# Patient Record
Sex: Male | Born: 1950
Health system: Southern US, Community
[De-identification: ages and names within clinical notes are randomized; demographics above are authoritative.]

## PROBLEM LIST (undated history)

## (undated) DIAGNOSIS — E119 Type 2 diabetes mellitus without complications: Secondary | ICD-10-CM

## (undated) DIAGNOSIS — I10 Essential (primary) hypertension: Secondary | ICD-10-CM

## (undated) DIAGNOSIS — E785 Hyperlipidemia, unspecified: Secondary | ICD-10-CM

## (undated) DIAGNOSIS — K859 Acute pancreatitis without necrosis or infection, unspecified: Secondary | ICD-10-CM

## (undated) DIAGNOSIS — K219 Gastro-esophageal reflux disease without esophagitis: Secondary | ICD-10-CM

## (undated) HISTORY — PX: UPPER GASTROINTESTINAL ENDOSCOPY: SHX188

## (undated) HISTORY — PX: COLONOSCOPY: SHX174

## (undated) HISTORY — PX: CHOLECYSTECTOMY: SHX55

## (undated) HISTORY — DX: Hyperlipidemia, unspecified: E78.5

## (undated) HISTORY — DX: Acute pancreatitis without necrosis or infection, unspecified: K85.90

## (undated) HISTORY — DX: Type 2 diabetes mellitus without complications: E11.9

## (undated) HISTORY — DX: Essential (primary) hypertension: I10

## (undated) HISTORY — DX: Gastro-esophageal reflux disease without esophagitis: K21.9

---

## 2018-12-05 ENCOUNTER — Telehealth: Payer: Self-pay

## 2018-12-05 NOTE — Telephone Encounter (Signed)

## 2018-12-06 ENCOUNTER — Encounter: Payer: Self-pay | Admitting: Family Medicine

## 2018-12-06 ENCOUNTER — Other Ambulatory Visit: Payer: Self-pay

## 2018-12-06 ENCOUNTER — Ambulatory Visit (INDEPENDENT_AMBULATORY_CARE_PROVIDER_SITE_OTHER): Payer: Medicare Other | Admitting: Family Medicine

## 2018-12-06 VITALS — BP 110/70 | HR 86 | Ht 68.0 in | Wt 211.0 lb

## 2018-12-06 DIAGNOSIS — E7800 Pure hypercholesterolemia, unspecified: Secondary | ICD-10-CM

## 2018-12-06 DIAGNOSIS — K219 Gastro-esophageal reflux disease without esophagitis: Secondary | ICD-10-CM | POA: Diagnosis not present

## 2018-12-06 DIAGNOSIS — E1040 Type 1 diabetes mellitus with diabetic neuropathy, unspecified: Secondary | ICD-10-CM

## 2018-12-06 DIAGNOSIS — I10 Essential (primary) hypertension: Secondary | ICD-10-CM | POA: Diagnosis not present

## 2018-12-06 DIAGNOSIS — H9110 Presbycusis, unspecified ear: Secondary | ICD-10-CM | POA: Diagnosis not present

## 2018-12-06 DIAGNOSIS — Z0001 Encounter for general adult medical examination with abnormal findings: Secondary | ICD-10-CM

## 2018-12-06 LAB — URINALYSIS, ROUTINE W REFLEX MICROSCOPIC
Bilirubin Urine: NEGATIVE
Hgb urine dipstick: NEGATIVE
Ketones, ur: NEGATIVE
Leukocytes,Ua: NEGATIVE
Nitrite: NEGATIVE
RBC / HPF: NONE SEEN (ref 0–?)
Specific Gravity, Urine: 1.02 (ref 1.000–1.030)
Urine Glucose: NEGATIVE
Urobilinogen, UA: 0.2 (ref 0.0–1.0)
pH: 5.5 (ref 5.0–8.0)

## 2018-12-06 LAB — CBC
HCT: 45.3 % (ref 39.0–52.0)
Hemoglobin: 15 g/dL (ref 13.0–17.0)
MCHC: 33.1 g/dL (ref 30.0–36.0)
MCV: 85.1 fl (ref 78.0–100.0)
Platelets: 194 10*3/uL (ref 150.0–400.0)
RBC: 5.32 Mil/uL (ref 4.22–5.81)
RDW: 14.1 % (ref 11.5–15.5)
WBC: 7.8 10*3/uL (ref 4.0–10.5)

## 2018-12-06 LAB — COMPREHENSIVE METABOLIC PANEL
ALT: 21 U/L (ref 0–53)
AST: 24 U/L (ref 0–37)
Albumin: 4.2 g/dL (ref 3.5–5.2)
Alkaline Phosphatase: 76 U/L (ref 39–117)
BUN: 31 mg/dL — ABNORMAL HIGH (ref 6–23)
CO2: 27 mEq/L (ref 19–32)
Calcium: 9.4 mg/dL (ref 8.4–10.5)
Chloride: 104 mEq/L (ref 96–112)
Creatinine, Ser: 1.4 mg/dL (ref 0.40–1.50)
GFR: 50.66 mL/min — ABNORMAL LOW (ref >60.00)
Glucose, Bld: 141 mg/dL — ABNORMAL HIGH (ref 70–99)
Potassium: 4.1 mEq/L (ref 3.5–5.1)
Sodium: 140 mEq/L (ref 135–145)
Total Bilirubin: 0.8 mg/dL (ref 0.2–1.2)
Total Protein: 7.2 g/dL (ref 6.0–8.3)

## 2018-12-06 LAB — LIPID PANEL
Cholesterol: 213 mg/dL — ABNORMAL HIGH (ref 0–200)
HDL: 34.1 mg/dL — ABNORMAL LOW (ref >39.00)
NonHDL: 179.2
Total CHOL/HDL Ratio: 6
Triglycerides: 249 mg/dL — ABNORMAL HIGH (ref 0.0–149.0)
VLDL: 49.8 mg/dL — ABNORMAL HIGH (ref 0.0–40.0)

## 2018-12-06 LAB — MICROALBUMIN / CREATININE URINE RATIO
Creatinine,U: 115.8 mg/dL
Microalb Creat Ratio: 14 mg/g (ref 0.0–30.0)
Microalb, Ur: 16.2 mg/dL — ABNORMAL HIGH (ref 0.0–1.9)

## 2018-12-06 LAB — LDL CHOLESTEROL, DIRECT: Direct LDL: 129 mg/dL

## 2018-12-06 LAB — HEMOGLOBIN A1C: Hgb A1c MFr Bld: 7.3 % — ABNORMAL HIGH (ref 4.6–6.5)

## 2018-12-06 MED ORDER — GABAPENTIN 300 MG PO CAPS: 300.0000 mg | ORAL_CAPSULE | Freq: Every day | ORAL | 3 refills | Status: DC

## 2018-12-06 NOTE — Progress Notes (Addendum)
Established Patient Office Visit  Subjective:  Patient ID: Marco Payne, male    DOB: July 13, 1950  Age: 68 y.o. MRN: [id number]  CC:  Chief Complaint  Patient presents with  . Establish Care    HPI [name] presents for establishment of care he recently moved into the area.  Tells that he has been an insulin-dependent diabetic for 35 years.  Diabetes is been under so-so control.  Fasting sugar this morning was 135.  Status post eye check 4 months ago and was told that his eyes were okay.  He does experience some burning and heaviness in his feet.  No recent dental care but has partial bridges.  He has not had a colonoscopy recently.  He is retired from work.  He does not do regular exercise.  Blood pressure is controlled with amlodipine and Zestoretic.  Currently not taking anything for his cholesterol.  History of GERD that is been controlled with pantoprazole.  History reviewed. No pertinent past medical history.  History reviewed. No pertinent surgical history.  History reviewed. No pertinent family history.  Social History   Socioeconomic History  . Marital status: Divorced    Spouse name: Not on file  . Number of children: Not on file  . Years of education: Not on file  . Highest education level: Not on file  Occupational History  . Not on file  Social Needs  . Financial resource strain: Not on file  . Food insecurity    Worry: Not on file    Inability: Not on file  . Transportation needs    Medical: Not on file    Non-medical: Not on file  Tobacco Use  . Smoking status: Never Smoker  . Smokeless tobacco: Never Used  Substance and Sexual Activity  . Alcohol use: Not on file    Comment: rarely  . Drug use: Never  . Sexual activity: Not on file  Lifestyle  . Physical activity    Days per week: Not on file    Minutes per session: Not on file  . Stress: Not on file  Relationships  . Social Herbalist on phone: Not on file    Gets together: Not  on file    Attends religious service: Not on file    Active member of club or organization: Not on file    Attends meetings of clubs or organizations: Not on file    Relationship status: Not on file  . Intimate partner violence    Fear of current or ex partner: Not on file    Emotionally abused: Not on file    Physically abused: Not on file    Forced sexual activity: Not on file  Other Topics Concern  . Not on file  Social History Narrative  . Not on file    Outpatient Medications Prior to Visit  Medication Sig Dispense Refill  . amLODipine (NORVASC) 10 MG tablet Take 10 mg by mouth daily.    . insulin aspart (NOVOLOG FLEXPEN) 100 UNIT/ML FlexPen Inject 20 Units into the skin 3 (three) times daily with meals.    . Insulin Glargine (BASAGLAR KWIKPEN) 100 UNIT/ML SOPN Inject 30 Units into the skin at bedtime.    Marland Kitchen lisinopril-hydrochlorothiazide (ZESTORETIC) 20-12.5 MG tablet Take 1 tablet by mouth daily.    . pantoprazole (PROTONIX) 40 MG tablet Take 40 mg by mouth daily.     No facility-administered medications prior to visit.     No Known Allergies  ROS Review of Systems  Constitutional: Negative.   HENT: Negative.   Eyes: Negative for photophobia and visual disturbance.  Respiratory: Negative.   Cardiovascular: Negative.   Gastrointestinal: Negative.   Endocrine: Negative for polyphagia and polyuria.  Genitourinary: Positive for difficulty urinating, frequency and urgency.  Musculoskeletal: Negative for gait problem and joint swelling.  Skin: Negative for pallor and rash.  Allergic/Immunologic: Negative for immunocompromised state.  Neurological: Negative for seizures and speech difficulty.  Hematological: Does not bruise/bleed easily.  Psychiatric/Behavioral: Negative.       Objective:    Physical Exam  Constitutional: He is oriented to person, place, and time. He appears well-developed and well-nourished. No distress.  HENT:  Head: Normocephalic and atraumatic.   Right Ear: External ear normal.  Left Ear: External ear normal.  Eyes: Pupils are equal, round, and reactive to light. Conjunctivae are normal. Right eye exhibits no discharge. Left eye exhibits no discharge. No scleral icterus.  Neck: Neck supple. No JVD present. No tracheal deviation present. No thyromegaly present.  Cardiovascular: Normal rate, regular rhythm and normal heart sounds.  Pulses:      Dorsalis pedis pulses are 1+ on the right side and 1+ on the left side.       Posterior tibial pulses are 1+ on the right side and 1+ on the left side.  Pulmonary/Chest: Effort normal and breath sounds normal. No stridor.  Abdominal: Soft. Bowel sounds are normal. He exhibits no distension. There is no abdominal tenderness. There is no rebound.  Musculoskeletal:        General: No edema.  Lymphadenopathy:    He has no cervical adenopathy.  Neurological: He is alert and oriented to person, place, and time.  Skin: Skin is warm and dry. He is not diaphoretic.  Psychiatric: He has a normal mood and affect. His behavior is normal.   Diabetic Foot Exam - Simple   Simple Foot Form Diabetic Foot exam was performed with the following findings: Yes 12/06/2018 10:11 AM  Visual Inspection No deformities, no ulcerations, no other skin breakdown bilaterally: Yes See comments: Yes Sensation Testing Intact to touch and monofilament testing bilaterally: Yes Pulse Check See comments: Yes Comments Feet are cavus.  Capillary refill brisk.      BP 110/70   Pulse 86   Ht 5\' 8"  (1.727 m)   Wt 211 lb (95.7 kg)   SpO2 96%   BMI 32.08 kg/m  Wt Readings from Last 3 Encounters:  12/06/18 211 lb (95.7 kg)   BP Readings from Last 3 Encounters:  12/06/18 110/70   Guideline developer:  UpToDate (see UpToDate for funding source) Date Released: June 2014  Health Maintenance Due  Topic Date Due  . Hepatitis C Screening  Aug 03, 1950  . OPHTHALMOLOGY EXAM  08/31/1962  . TETANUS/TDAP  08/31/1971  .  COLONOSCOPY  08/31/2002  . PNA vac Low Risk Adult (1 of 2 - PCV13) 08/30/2017  . INFLUENZA VACCINE  10/19/2018    There are no preventive care reminders to display for this patient.  No results found for: TSH Lab Results  Component Value Date   WBC 7.8 12/06/2018   HGB 15.0 12/06/2018   HCT 45.3 12/06/2018   MCV 85.1 12/06/2018   PLT 194.0 12/06/2018   Lab Results  Component Value Date   NA 140 12/06/2018   K 4.1 12/06/2018   CO2 27 12/06/2018   GLUCOSE 141 (H) 12/06/2018   BUN 31 (H) 12/06/2018   CREATININE 1.40 12/06/2018   BILITOT 0.8  12/06/2018   ALKPHOS 76 12/06/2018   AST 24 12/06/2018   ALT 21 12/06/2018   PROT 7.2 12/06/2018   ALBUMIN 4.2 12/06/2018   CALCIUM 9.4 12/06/2018   GFR 50.66 (L) 12/06/2018   Lab Results  Component Value Date   CHOL 213 (H) 12/06/2018   Lab Results  Component Value Date   HDL 34.10 (L) 12/06/2018   [facility] results found for: [facility] Results  Component Value Date   TRIG 249.0 (H) 12/06/2018   Lab Results  Component Value Date   CHOLHDL 6 12/06/2018   Lab Results  Component Value Date   HGBA1C 7.3 (H) 12/06/2018      Assessment & Plan:   Problem List Items Addressed This Visit      Cardiovascular and Mediastinum   Essential hypertension - Primary   Relevant Medications   lisinopril-hydrochlorothiazide (ZESTORETIC) 20-12.5 MG tablet   amLODipine (NORVASC) 10 MG tablet   atorvastatin (LIPITOR) 20 MG tablet   Other Relevant Orders   CBC (Completed)   Comprehensive metabolic panel (Completed)   Urinalysis, Routine w reflex microscopic (Completed)   Microalbumin / creatinine urine ratio (Completed)     Digestive   Gastroesophageal reflux disease   Relevant Medications   pantoprazole (PROTONIX) 40 MG tablet   Other Relevant Orders   Ambulatory referral to Gastroenterology     Endocrine   Type 1 diabetes mellitus with diabetic neuropathy, unspecified (HCC)   Relevant Medications   lisinopril-hydrochlorothiazide  (ZESTORETIC) 20-12.5 MG tablet   Insulin Glargine (BASAGLAR KWIKPEN) 100 UNIT/ML SOPN   insulin aspart (NOVOLOG FLEXPEN) 100 UNIT/ML FlexPen   gabapentin (NEURONTIN) 300 MG capsule   atorvastatin (LIPITOR) 20 MG tablet   Other Relevant Orders   CBC (Completed)   Comprehensive metabolic panel (Completed)   LDL cholesterol, direct (Completed)   Hemoglobin A1c (Completed)   Lipid panel (Completed)   Urinalysis, Routine w reflex microscopic (Completed)   Microalbumin / creatinine urine ratio (Completed)   Ambulatory referral to Endocrinology     Nervous and Auditory   Presbycusis   Relevant Orders   Ambulatory referral to Audiology     Other   Encounter for health maintenance examination with abnormal findings   Elevated LDL cholesterol level   Relevant Medications   atorvastatin (LIPITOR) 20 MG tablet      Meds ordered this encounter  Medications  . gabapentin (NEURONTIN) 300 MG capsule    Sig: Take 1 capsule (300 mg total) by mouth at bedtime.    Dispense:  90 capsule    Refill:  3  . atorvastatin (LIPITOR) 20 MG tablet    Sig: Take 1 tablet (20 mg total) by mouth daily.    Dispense:  90 tablet    Refill:  3    Follow-up: Return in about 3 months (around 03/07/2019).   Patient was given information on diabetes, colorectal cancer screening, preventing high cholesterol and neuropathic pain all in Spanish.

## 2018-12-06 NOTE — Patient Instructions (Signed)
Dolor neuroptico Neuropathic Pain El dolor neuroptico se produce cuando hay dao en los nervios responsables de ciertas sensaciones del cuerpo (nervios sensitivos). Las causas del dolor pueden ser las siguientes:  Dao a los nervios sensitivos que envan seales a la mdula espinal y el cerebro (sistema nervioso perifrico).  Dao a los nervios sensitivos del cerebro o la mdula espinal (sistema nervioso central). El dolor neuroptico puede volverlo ms sensible al [employer]. An una sensacin leve puede sentirse Writer. Por lo general, es una enfermedad a largo plazo que puede ser difcil de tratar. El tipo de dolor difiere de Ardelia Mems persona a Theatre manager. Puede:  Comenzar en forma repentina (agudo) o puede desarrollarse lentamente y durar Management consultant (crnico).  Aparecer y Armed forces operational officer a medida que los nervios daados se curan, o puede permanecer estable durante aos.  Provoca [place], prdida de sueo y [place] calidad de vida deficiente. Cules son las causas? La causa ms frecuente de esta afeccin es la diabetes. El dolor neuroptico tambin puede producirse por muchas otras enfermedades y afecciones. Las causas del dolor neuroptico pueden clasificarse como:  Txicas. Esto es causado por medicamentos y sustancias qumicas. La causa ms comn del dolor neuroptico por exposicin a sustancias txicas es el dao que producen los tratamientos para Science writer (quimioterapia).  Metablicas. Esto puede ser consecuencia de: ? Diabetes. Esta es la enfermedad ms comn que provoca daos en los nervios. ? Falta de vitamina B debido al consumo de alcohol prolongado.  Traumticas. Las lesiones que cortan, presionan o estiran un nervio pueden producir dao y Social research officer, government. Un ejemplo comn es sentir dolor despus de perder un brazo o una pierna (dolor de miembro fantasma).  Causas relacionadas con la compresin. Si un nervio sensitivo queda atrapado o comprimido por [employer], el suministro de sangre al  nervio puede interrumpirse.  Vascular. Muchas enfermedades de los vasos sanguneos pueden producir dolor neuroptico al reducir el suministro de [place] y el oxgeno que Lucianne Lei a los nervios.  Autoinmune. Este tipo de Social research officer, government se produce por las [employer] el sistema de defensa del cuerpo (sistema inmunitario) ataca por error los nervios sensitivos. Entre los ejemplos de enfermedades autoinmunes que pueden causar dolor neuroptico se incluyen el lupus y Curator.  Infecciosa. Muchos tipos de infecciones virales pueden daar los nervios sensitivos y Engineer, drilling. La infeccin por culebrilla (virus del herpes zster) es una causa comn de este tipo de Social research officer, government.  Heredados. El dolor neuroptico puede ser un sntoma de muchas enfermedades que se transmiten entre los miembros de las familias (genticas). Qu incrementa el riesgo? Es ms probable que usted sufra esta afeccin si:  Tiene diabetes.  Fuma.  Bebe alcohol en exceso.  Toma determinados medicamentos, incluidos medicamentos que destruyen las clulas cancerosas (quimioterapia) o que se utilizan para tratar trastornos del sistema inmunitario. Cules son los signos o los sntomas? El sntoma principal es Conservation officer, historic buildings. El dolor neuroptico a menudo se describe como:  Insurance claims handler.  Similar a un choque.  Escozor.  Fro o calor.  Picazn. Cmo se diagnostica? No hay un estudio que pueda diagnosticar el dolor neuroptico. Se diagnostica en funcin de lo siguiente:  Un examen fsico y sus sntomas. El mdico le har preguntas acerca de su dolor. Pueden solicitarle que use una escala de dolor para describir la intensidad del dolor.  Estudios. Estos pueden realizarse para corroborar su sensibilidad al dolor y para ayudar a Animator causa y la ubicacin de los daos en los nervios sensitivos. Estos  incluyen los siguientes: ? Estudios de conduccin nerviosa para evaluar si las seales nerviosas pasan por los nervios  sensitivos en forma correcta o no (estudios electrodiagnsticos). ? Estimulacin de los nervios sensitivos a travs de electrodos que se colocan en la piel, y la medicin de la respuesta en la mdula espinal y el cerebro (potencial evocado somatosensorial).  Estudios de diagnstico por imgenes, por ejemplo: ? Radiografas. ? Exploracin por tomografa computarizada (TC). ? Resonancia magntica (RM). Cmo se trata? El tratamiento para el dolor neuroptico puede cambiar con el [place]. Es posible que necesite probar distintas opciones de tratamientos o una combinacin de tratamientos. Entre las opciones se incluyen las siguientes:  Tratamiento de la causa preexistente de la neuropata, como la diabetes, una enfermedad renal o la deficiencia de vitaminas.  Interrupcin de los [employer] pueden causar la neuropata, como la quimioterapia.  Medicamentos para Engineer, materials. Entre los medicamentos se [employer] siguientes: ? Analgsicos de venta libre o recetados. ? Anticonvulsivos. ? Antidepresivos. ? Parches analgsicos que se colocan en las zonas de la piel que le duelen. ? Un medicamento para adormecer el rea (anestesia local), que puede inyectarse como un bloqueo nervioso.  Neuroestimulacin transcutnea. En este tratamiento se utilizan corrientes elctricas para bloquear las seales nerviosas dolorosas. El tratamiento es indoloro.  Tratamientos alternativos, como: ? Acupuntura. ? Meditacin. ? Masajes. ? Fisioterapia. ? Programas para el control del dolor. ? Psicoterapia. Siga estas indicaciones en su casa: Medicamentos   [employer] International de venta libre y los recetados solamente como se lo haya indicado el mdico.  No conduzca ni use maquinaria pesada mientras toma analgsicos recetados.  Si toma analgsicos recetados, tome medidas para prevenir o tratar el estreimiento. El mdico puede recomendarle que: ? Beba suficiente lquido como para mantener la orina de  color amarillo plido. ? Consuma alimentos ricos en fibra, como frutas y verduras frescas, cereales integrales y frijoles. ? Limite el consumo de alimentos ricos en grasa y azcares procesados, como los alimentos fritos o dulces. ? Tome un medicamento recetado o de venta libre para el estreimiento. Estilo de vida   Tenga un buen sistema de Immunologist.  Considere la opcin de unirse a un grupo de apoyo para Chief Technology Officer crnico.  No consuma ningn producto que contenga nicotina o tabaco, como cigarrillos y Administrator, Civil Service. Si necesita ayuda para dejar de fumar, consulte al mdico.  No beba alcohol. Instrucciones generales  Infrmese todo lo que pueda sobre su enfermedad.  Trabaje en estrecha colaboracin con todos sus mdicos para hallar el tratamiento ms adecuado para usted.  Pregntele al mdico qu actividades son seguras para usted.  Concurra a todas las visitas de control como se lo haya indicado el mdico. Esto es importante. Comunquese con un mdico si:  Sus tratamientos para el dolor no funcionan.  Los [employer] causan [employer] Corporation.  Est lidiando con sntomas de cansancio (fatiga), cambios en el estado de nimo, depresin o ansiedad. Resumen  El dolor neuroptico se produce cuando hay dao en los nervios responsables de ciertas sensaciones del cuerpo (nervios sensitivos).  El dolor neuroptico puede aparecer y Geneticist, molecular a medida que los nervios daados se curan, o puede permanecer estable durante aos.  Por lo general, el dolor neuroptico es una enfermedad a largo plazo que puede ser difcil de Warehouse manager. Considere la opcin de unirse a un grupo de apoyo para Chief Technology Officer crnico. Esta informacin no tiene Theme park manager el consejo del mdico. Asegrese de hacerle al mdico  cualquier pregunta que tenga. Document Released: 06/13/2007 Document Revised: 05/16/2017 Document Reviewed: 05/16/2017 Elsevier Patient Education  2020 [employer].   Prevencin de las complicaciones de la diabetes mellitus Preventing Diabetes Mellitus Complications Usted puede actuar para prevenir o disminuir los problemas causados por la diabetes (diabetes mellitus). Seguir un plan para la diabetes y cuidarse usted mismo puede reducir el riesgo de complicaciones graves o potencialmente mortales. Qu puedo hacer para prevenir las complicaciones de la diabetes? Controle su diabetes   Siga las indicaciones del mdico acerca de cmo tratar la diabetes. Su diabetes puede ser tratada por un equipo de profesionales de la salud que le pueden ensear a cuidarse y pueden responder las preguntas que tenga.  Instryase sobre su afeccin para tomar decisiones saludables en relacin a los alimentos y la actividad fsica.  Controle su nivel de azcar en la sangre (glucosa) con la frecuencia que le hayan indicado. El mdico lo ayudar a decidir con qu frecuencia debe revisar su nivel de glucemia, en funcin de los objetivos de su tratamiento y el xito en cumplirlos.  Consltele a su mdico si debe tomar aspirina en dosis bajas a diario y cul es la dosis recomendada para usted. Tomar aspirina en dosis bajas a diario se recomienda para ayudar a prevenir la enfermedad cardiovascular. No consuma nicotina ni tabaco No consuma ningn producto que contenga nicotina o tabaco, como cigarrillos y cigarrillos electrnicos. Si necesita ayuda para dejar de fumar, consulte al mdico. La nicotina aumenta el riesgo de problemas con la diabetes. Si deja de consumir nicotina:  Se reduce el riesgo de infarto de miocardio, accidente cerebrovascular, enfermedades del sistema nervioso y enfermedad renal.  Pueden mejorar el colesterol y sus niveles de presin arterial.  La circulacin de la sangre mejorar. Mantenga su presin arterial bajo control Su objetivo de presin arterial personal se determina sobre la base de:  Su edad.  Los medicamentos que toma.  El tiempo transcurrido  desde que tiene diabetes.  Otras afecciones mdicas que tenga. Para controlar su presin arterial:  Siga las indicaciones del mdico sobre la planificacin de las comidas, el ejercicio y los medicamentos.  Asegrese de que el [employer] mida la presin arterial en cada visita mdica.  Contrlese la presin arterial en su casa segn las indicaciones del mdico.  Mantenga los niveles de colesterol bajo control Para controlar su colesterol:  Siga las indicaciones del mdico sobre la planificacin de las comidas, el ejercicio y los medicamentos.  Controle su nivel de colesterol por lo menos una vez al ao.  Es posible que le receten medicamentos para bajar sus niveles de colesterol (estatinas). Si no est tomando estatinas, pregntele a su mdico si debera tomar una. Controlar su colesterol, puede:  Ayudarlo a prevenir enfermedades cardacas y un accidente cerebrovascular. Estos son los problemas de salud ms comunes para las personas con diabetes.  Mejorar el flujo de [place]. Planifique y cumpla con sus exmenes fsicos y oculares anuales Su mdico le informar con qu frecuencia deber asistir a visitas mdicas, dependiendo de su plan de control de la diabetes. Concurra a todas las visitas de seguimiento como se le haya indicado. Esto es importante para identificar rpidamente posibles problemas y poder evitar o tratar las complicaciones.  Cada visita a su mdico deber incluir la medicin de: ? Su peso. ? Presin arterial. ? Nivel de glucemia.  Su nivel de A1c (hemoglobina A1c) debe controlarse: ? Al menos 2 veces al ao, si cumple los objetivos del tratamiento. ? [employer] al  ao, si no cumple los objetivos del tratamiento o si sus objetivos [place]han cambiado.  Los lpidos de la sangre (perfil lipdico) deben controlarse anualmente. Tambin se debe controlar anualmente la presencia de protenas en la orina (microalbuminuria).  Si tiene diabetes tipo1, hgase un examen ocular en  el trmino de 3 a 5aos despus del diagnstico y, luego, Neomia Dearuna vez al ao despus del Risk managerprimer examen.  Si tiene diabetes tipo2, hgase un examen ocular tan pronto como le diagnostiquen la enfermedad y, Mercerluego, una vez por ao despus del Risk managerprimer examen. Mantngase al da con las vacunas Se recomienda que reciba:  [place] PrincipeVacuna antigripal (gripe) todos los aos.  Vacuna contra la neumona (vacuna antineumoccica) y contra la hepatitisB. Si es mayor de 65aos, puede recibir la vacuna antineumoccica como una serie de dos inyecciones West Parkdiferentes. Pregntele al mdico qu otras vacunas puede recomendar para usted. Cuide sus pies La diabetes puede hacer que la circulacin sangunea en las piernas y los pies sea deficiente. Por eso, el cuidado de los pies es muy importante. La diabetes puede provocar:  Que la piel de los pies se vuelva ms fina, se rompa ms fcilmente y cicatrice ms lentamente.  Dao nervioso en las piernas y los pies, lo que puede Forensic scientistresultar en una disminucin de la sensibilidad. Es posible que no advierta las heridas ms pequeas que pueden conducir a Systems developerproblemas graves. Para evitar problemas en los pies:  Examine a diario su piel y pies en busca de cortes, moretones, enrojecimiento, ampollas o llagas.  Programe una cita para que el Office Depotmdico le controle los pies una vez por ao. Este examen incluye: ? Inspeccionar la estructura y la piel de sus pies. ? Revisar los pulsos y sensaciones de sus pies.  Asegrese de que su mdico realice un examen visual de los pies en cada visita mdica.  Cuide sus Ameren Corporationdientes Las personas con diabetes mal controlada son ms propensas a Immunologisttener enfermedades en las encas (periodontales). La diabetes puede hacer que las enfermedades periodontales sean ms difciles de Chief Operating Officercontrolar. Las enfermedades periodontales, si no se tratan, pueden conducir a la prdida de dientes. Para prevenirlas:  Cepllese los Jacobs Engineeringdientes dos veces al da.  Use hilo dental al menos una vez al  da.  Visite al Group 1 Automotivedentista dos veces por ao. Beba de manera responsable Limite el consumo de alcohol a no ms de 1medida por da si es mujer y no est Dodsonembarazada, y a 2medidas por da si es hombre. Una medida equivale a 12oz (355ml) de cerveza, 5oz (148ml) de vino o 1oz (44ml) de bebidas alcohlicas de alta graduacin.  Es importante que consuma alimentos cuando bebe alcohol para evitar la glucemia baja (hipoglucemia). Evite consumir alcohol si:  Tiene antecedentes de consumo excesivo o dependencia de alcohol.  Est embarazada.  Tiene enfermedad heptica, pancreatitis, neuropata avanzada, o hipertrigliceridemia grave. Disminuya el nivel de estrs Vivir con diabetes puede ser estresante. Cuando usted est bajo estrs, la glucemia puede verse afectada de dos maneras:  Las hormonas del estrs pueden hacer que el nivel de glucemia suba.  Probablemente deje de cuidarse como debera. Est al tanto del nivel de estrs y haga los cambios que sean necesarios para ayudar a Company secretarymanejar las situaciones difciles. Para disminuir sus niveles de estrs:  Considere la posibilidad de Advertising account plannerparticipar en un grupo de apoyo.  Realice relajacin o meditacin planificada.  Adopte un pasatiempo que disfrute.  Mantenga relaciones saludables.  Hacer ejercicio regularmente.  Trabaje con su mdico o un profesional de la salud mental. Resumen  Usted  puede actuar para prevenir o [employer] problemas causados por la diabetes (diabetes mellitus). Seguir un plan para la diabetes y cuidarse usted mismo puede reducir el riesgo de complicaciones graves o potencialmente mortales.  Siga las indicaciones del mdico acerca de cmo tratar la diabetes. Su diabetes puede ser tratada por un equipo de profesionales de la salud que le pueden ensear a cuidarse y pueden responder las preguntas que tenga.  Su mdico le informar con qu frecuencia deber asistir a visitas mdicas, dependiendo de su plan de control de la  diabetes. Concurra a todas las visitas de seguimiento como se le haya indicado. Esto es importante para identificar rpidamente posibles problemas y poder evitar o tratar las complicaciones. Esta informacin no tiene Marine scientist el consejo del mdico. Asegrese de hacerle al mdico cualquier pregunta que tenga. Document Released: 02/23/2011 Document Revised: 01/04/2017 Document Reviewed: 04/30/2013 Elsevier Patient Education  [place] deteccin de Surveyor, minerals Colorectal Cancer Screening  Las pruebas de deteccin de cncer colorrectal se usan para confirmar o descartar este tipo de cncer antes de que se desarrollen los sntomas. Colorrectal se refiere al colon y al recto. El colon y el recto se encuentran al final del tubo digestivo y all se eliminan las deposiciones hacia fuera del cuerpo. Quin debe realizarse la prueba de deteccin? [employer] adultos a Proofreader de los 50 aos [employer] 75 aos deben hacerse pruebas de Programme researcher, broadcasting/film/video. El mdico puede recomendarle las pruebas de deteccin a partir de los 40 aos. Le realizarn pruebas cada 1 a 10 aos, segn los [place] y el tipo de prueba de Programme researcher, broadcasting/film/video. Puede realizarse pruebas de deteccin a partir de una edad ms temprana o con ms frecuencia que [employer], si usted tiene alguno de los siguientes factores de riesgo:  Antecedentes personales o familiares de cncer colorrectal o bultos anormales (plipos).  Una enfermedad inflamatoria del intestino, como colitis ulcerosa o enfermedad de Crohn.  Antecedentes de radioterapia en el abdomen o rea plvica para tratamiento de cncer.  Sntomas de Surveyor, minerals, como cambios en los hbitos intestinales o sangre en las heces.  Un tipo de sndrome de cncer de colon que se transmite de padres a hijos (hereditario), como: ? Sndrome de Field seismologist. ? Poliposis adenomatosa familiar. ? Sndrome de [place]. ? Sndrome de Peutz-Jeghers. Las recomendaciones de  pruebas de Programme researcher, broadcasting/film/video para los adultos que tienen entre 92 y 65 aos de edad Carnuel segn la salud. Cmo se realiza este estudio? Hay diversos tipos de pruebas de Manufacturing systems engineer. Pueden hacerle una o ms de las siguientes:  Prueba de sangre oculta en la materia fecal con guayacol. Para realizar esta prueba, se examina una muestra de heces (materia fecal) a fin de Consulting civil engineer oculta (escondida), lo que podra ser un signo de Surveyor, minerals.  Prueba inmunoqumica fecal (PIF). Para realizar esta prueba, se examina Truddie Coco de heces a fin de detectar la presencia de [place], lo que podra ser un signo de Surveyor, minerals.  Prueba de cido desoxirribonucleico (ADN) en heces. Para realizar esta prueba, se examina Truddie Coco de heces a fin de Product manager presencia de sangre y cambios en el ADN que podran Cabin crew.  Sigmoidoscopa. Durante esta prueba, se utiliza un tubo flexible y delgado con una cmara en el extremo (sigmoidoscopio) para examinar el recto y la parte inferior del colon.  Colonoscopa. Durante esta prueba, se utiliza un tubo largo y flexible con una cmara en el extremo (colonoscopio) para  examinar todo el colon y el recto. Mediante una colonoscopa es posible tomar una muestra de tejido (biopsia) y extirpar pequeos plipos durante la prueba.  Colonoscopa virtual. En lugar de un colonoscopio, en este tipo de colonoscopa se utilizan radiografas (exploracin por tomografa computarizada (TC)) y computadoras para generar imgenes del colon y el recto. Cules son los beneficios de las pruebas de deteccin? Las pruebas de deteccin reducen el riesgo de cncer colorrectal y pueden ayudar a identificar el cncer en una etapa temprana, cuando se puede extirpar o tratar con mayor facilidad. Es comn que se formen plipos en el revestimiento del colon, especialmente a medida que envejece. Estos plipos pueden ser cancerosos o volverse cancerosos  con el [place]. Las pruebas de deteccin pueden identificar estos plipos. Cules son los riesgos de la prueba de deteccin? Cada prueba de deteccin puede Energy [employer] Partners.  Las pruebas de muestras de heces tienen menos riesgos que otros tipos de pruebas de Airline pilot. Sin embargo, es posible que deba realizarse ms pruebas para [employer] de [place] prueba de [place] de [place].  Las pruebas de deteccin con radiografas lo exponen a niveles bajos de radiacin, lo que puede aumentar ligeramente el riesgo de Database administrator. El beneficio de Architectural technologist cncer supera el ligero aumento del [place].  Las pruebas de Designer, industrial/product la sigmoidoscopa y Engineer, agricultural colonoscopa pueden conllevar riesgo de sangrado, dao intestinal, infeccin o una reaccin a los medicamentos administrados durante el examen. Consulte a su mdico para comprender su riesgo de Public relations account executive y para elaborar un plan de deteccin que sea adecuado para usted. Preguntas para hacerle al mdico  Cundo debo comenzar con las pruebas de deteccin del cncer colorrectal?  Cul es mi riesgo de Warehouse manager cncer colorrectal?  Con qu frecuencia tengo que realizarme pruebas de deteccin?  Qu pruebas de deteccin tengo que realizarme?  Cmo obtengo los resultados de la prueba?  Qu significan los resultados? Dnde buscar ms informacin Obtenga ms informacin sobre las pruebas de deteccin del Building services engineer en los siguientes sitios:  Sociedad Education officer, community (American Cancer Society): www.cancer.org  Training and development officer del Cncer [facility]): www.cancer.gov Resumen  Las pruebas de deteccin de Building services engineer se usan para Astronomer o Engineer, agricultural tipo de cncer antes de que se desarrollen los sntomas.  Las pruebas de deteccin reducen el riesgo de cncer colorrectal y pueden ayudar a identificar el cncer en una etapa temprana, cuando se puede extirpar o tratar con mayor  facilidad.  [employer] adultos a Glass blower/designer de los 50 aos [employer] 75 aos deben hacerse pruebas de Airline pilot. El mdico puede recomendarle las pruebas de deteccin a partir de los 45 aos.  Puede realizarse pruebas de deteccin a partir de una edad ms temprana o con ms frecuencia que [employer], si usted tiene determinados factores de Chief of Staff.  Consulte a su mdico para comprender su riesgo de Public relations account executive y para elaborar un plan de deteccin que sea adecuado para usted. Esta informacin no tiene Theme park manager el consejo del mdico. Asegrese de hacerle al mdico cualquier pregunta que tenga. Document Released: 11/29/2011 Document Revised: 04/11/2017 Document Reviewed: 02/02/2017 Elsevier Patient Education  2020 [employer].  Prevencin del colesterol alto Preventing High Cholesterol El colesterol es una sustancia cerosa parecida a la grasa que el organismo necesita en pequeas cantidades. El hgado fabrica todo el colesterol que el cuerpo necesita. Tener el colesterol alto (hipercolesterolemia) aumenta el riesgo de sufrir enfermedades cardacas y accidentes cerebrovasculares. El colesterol extra (  exceso de colesterol) proviene de los alimentos que come, por ejemplo grasas de origen animal (grasas saturadas) de la carne y de algunos productos lcteos. El colesterol alto con frecuencia puede prevenirse con cambios en la dieta y en el estilo de vida. Si ya tiene Engineer, productioncolesterol alto, puede controlarlo haciendo cambios en la dieta y en el estilo de vida, adems de con medicamentos. Qu cambios en la alimentacin se pueden hacer?  Coma menos grasas saturadas. Los alimentos que contienen grasas saturadas incluyen las carnes rojas y algunos productos lcteos.  Evite las carnes procesadas, como el tocino, los fiambres y embutidos.  Evite las grasas trans, que se encuentran en la margarina y en algunos productos horneados.  Evite alimentos y bebidas que tengan azcares agregados.   Consuma ms frutas, verduras y cereales integrales.  Elija fuentes saludables de protenas, como el pescado, la carne de ave y los frutos secos.  Elija fuentes saludables de grasas, por ejemplo: ? Frutos secos. ? Aceites vegetales, en particular el aceite de oliva. ? Pescados que contengan grasas saludables (cidos grasos omega-3), como la caballa o el salmn. Qu cambios en el estilo de vida se pueden realizar?   Baje de peso si es necesario. Bajar entre 5 y 10lb (2,3 a 4,5kg) puede ayudar a prevenir o Public house managercontrolar el colesterol alto y a Software engineerreducir el riesgo de padecer diabetes y presin arterial alta (hipertensin). Pdale al mdico que le recomiende una dieta y un plan de ejercicios para bajar de peso de forma segura.  Ejerctese lo suficiente. Debe realizar al menos 150minutos de ejercicios de intensidad moderada todas las semanas. ? Theatre stage manageruede realizar este tiempo en sesiones cortas de ejercicios, varias veces al da, o puede realizar sesiones ms largas, pero menos veces por semana. Por ejemplo, puede realizar una caminata enrgica o andar en bicicleta durante 10minutos, 3veces al da, durante 5das a la semana.  No fume. Si necesita ayuda para dejar de fumar, consulte al mdico.  Limite el consumo de bebidas alcohlicas. Si bebe alcohol, limite el consumo a no ms de 1medida por da si es mujer y no est Elbertonembarazada, y 2medidas por da si es hombre. Una medida equivale a 12onzas de cerveza, 5onzas de vino o 1onzas de bebidas alcohlicas de alta graduacin. Por qu son importantes estos cambios?  Si tiene Engineer, productioncolesterol alto, se pueden acumular depsitos de esta sustancia (placa) en las paredes de los vasos sanguneos. La placa hace que las arterias se vuelvan ms estrechas y rgidas, lo que puede limitar u obstruir la circulacin sangunea y Development worker, international aidprovocar la formacin de cogulos de Candy Kitchensangre. Esto aumenta en gran medida el riesgo de infarto de miocardio y de accidente cerebrovascular. Hacer  cambios en la dieta y en el estilo de vida puede ayudar a reducir el riesgo de sufrir estas afecciones potencialmente mortales. Qu puedo hacer para reducir mis riesgos?  Controle los factores de riesgo del colesterol alto. Hable con el mdico acerca de todos los factores de riesgo y cmo reducir Nurse, adultel riesgo.  Controle otras afecciones que pueda tener, por ejemplo diabetes o presin arterial alta (hipertensin).  Contrlese el colesterol a intervalos regulares.  Concurra a todas las visitas de control como se lo haya indicado el mdico. Esto es importante. Cmo se trata? Adems de los cambios en la dieta y en el estilo de vida, el mdico puede recomendarle que tome ciertos medicamentos para reducir el colesterol, por ejemplo, medicamentos que reducen la cantidad de colesterol producida por el hgado. Es posible que necesite tomar medicamentos si:  No logra reducir lo suficiente el colesterol con cambios en la dieta y en el estilo de vida.  Tiene colesterol alto y presenta otros factores de riesgo de sufrir enfermedades cardacas o accidentes cerebrovasculares. Tome los medicamentos de venta libre y los recetados solamente como se lo haya indicado el mdico. Dnde encontrar ms informacin  Asociacin Estadounidense de Firefighter (Facilities manager, Psychologist, educational): [phone]  Training and development officer de Firefighter, Corporate investment banker y Teacher, English as a foreign language (Armed forces training and education officer, Lung, and [employer], NHLBI): http://hood.com/ Resumen  El colesterol alto aumenta el riesgo de sufrir enfermedades cardacas y accidentes cerebrovasculares. Si mantiene el colesterol bajo, puede reducir el riesgo de tener estas afecciones.  Los [employer] dieta y en el estilo de vida son los pasos ms importantes para prevenir Nurse, learning disability.  Consulte al mdico para controlar los factores de riesgo y  hgase anlisis de sangre con regularidad. Esta informacin no tiene Theme park manager el consejo del mdico. Asegrese de hacerle al mdico cualquier pregunta que tenga. Document Released: 03/21/2015 Document Revised: 06/14/2016 Document Reviewed: 03/21/2015 Elsevier Patient Education  2020 [employer].

## 2018-12-09 DIAGNOSIS — E7800 Pure hypercholesterolemia, unspecified: Secondary | ICD-10-CM | POA: Insufficient documentation

## 2018-12-09 MED ORDER — ATORVASTATIN CALCIUM 20 MG PO TABS: 20.0000 mg | ORAL_TABLET | Freq: Every day | ORAL | 3 refills | Status: DC

## 2018-12-09 NOTE — Addendum Note (Signed)
Addended by: Jon Billings on: 12/09/2018 08:09 AM   Modules accepted: Orders

## 2018-12-13 ENCOUNTER — Encounter: Payer: Self-pay | Admitting: Gastroenterology

## 2018-12-24 DIAGNOSIS — Z570 Occupational exposure to noise: Secondary | ICD-10-CM | POA: Diagnosis not present

## 2018-12-24 DIAGNOSIS — H903 Sensorineural hearing loss, bilateral: Secondary | ICD-10-CM | POA: Diagnosis not present

## 2018-12-24 DIAGNOSIS — H9311 Tinnitus, right ear: Secondary | ICD-10-CM | POA: Diagnosis not present

## 2019-01-15 ENCOUNTER — Encounter: Payer: Self-pay | Admitting: Gastroenterology

## 2019-01-15 ENCOUNTER — Other Ambulatory Visit (INDEPENDENT_AMBULATORY_CARE_PROVIDER_SITE_OTHER): Payer: Medicare HMO

## 2019-01-15 ENCOUNTER — Ambulatory Visit (INDEPENDENT_AMBULATORY_CARE_PROVIDER_SITE_OTHER): Payer: Medicare HMO | Admitting: Gastroenterology

## 2019-01-15 VITALS — BP 118/62 | HR 70 | Temp 97.4°F | Ht 70.0 in | Wt 212.0 lb

## 2019-01-15 DIAGNOSIS — R1013 Epigastric pain: Secondary | ICD-10-CM

## 2019-01-15 LAB — CBC WITH DIFFERENTIAL/PLATELET
Basophils Absolute: 0.1 10*3/uL (ref 0.0–0.1)
Basophils Relative: 1 % (ref 0.0–3.0)
Eosinophils Absolute: 0.2 10*3/uL (ref 0.0–0.7)
Eosinophils Relative: 3.6 % (ref 0.0–5.0)
HCT: 42.3 % (ref 39.0–52.0)
Hemoglobin: 14.3 g/dL (ref 13.0–17.0)
Lymphocytes Relative: 33.2 % (ref 12.0–46.0)
Lymphs Abs: 2.2 10*3/uL (ref 0.7–4.0)
MCHC: 33.9 g/dL (ref 30.0–36.0)
MCV: 84.5 fl (ref 78.0–100.0)
Monocytes Absolute: 0.5 10*3/uL (ref 0.1–1.0)
Monocytes Relative: 7.9 % (ref 3.0–12.0)
Neutro Abs: 3.6 10*3/uL (ref 1.4–7.7)
Neutrophils Relative %: 54.3 % (ref 43.0–77.0)
Platelets: 176 10*3/uL (ref 150.0–400.0)
RBC: 5 Mil/uL (ref 4.22–5.81)
RDW: 13.5 % (ref 11.5–15.5)
WBC: 6.7 10*3/uL (ref 4.0–10.5)

## 2019-01-15 LAB — COMPREHENSIVE METABOLIC PANEL
ALT: 16 U/L (ref 0–53)
AST: 22 U/L (ref 0–37)
Albumin: 4.4 g/dL (ref 3.5–5.2)
Alkaline Phosphatase: 91 U/L (ref 39–117)
BUN: 29 mg/dL — ABNORMAL HIGH (ref 6–23)
CO2: 28 mEq/L (ref 19–32)
Calcium: 9.5 mg/dL (ref 8.4–10.5)
Chloride: 105 mEq/L (ref 96–112)
Creatinine, Ser: 1.27 mg/dL (ref 0.40–1.50)
GFR: 56.67 mL/min — ABNORMAL LOW (ref >60.00)
Glucose, Bld: 189 mg/dL — ABNORMAL HIGH (ref 70–99)
Potassium: 4.3 mEq/L (ref 3.5–5.1)
Sodium: 140 mEq/L (ref 135–145)
Total Bilirubin: 0.7 mg/dL (ref 0.2–1.2)
Total Protein: 7.6 g/dL (ref 6.0–8.3)

## 2019-01-15 MED ORDER — PANTOPRAZOLE SODIUM 40 MG PO TBEC: 40.0000 mg | DELAYED_RELEASE_TABLET | Freq: Every day | ORAL | 11 refills | Status: DC

## 2019-01-15 NOTE — Progress Notes (Signed)
HPI: This is a very pleasant 68 year old man who was referred to me by Libby Maw,*  to evaluate GERD.    I reviewed his primary care physician note from September 2020 stated that he was being referred here for history of GERD.  He is completely Spanish-speaking so the history was taken through the aid of a professional interpreter  Patient actually has quite a bit different story.  He does want refills on his Protonix 40 mg which she has been taking daily for looks like a year or so with good improvement in some epigastric pains.  He tells me he was seen in [place] sometime earlier this year and was told he had pancreatitis.  He has undergone an upper endoscopy sometime in 2020 also at upper endoscopy colonoscopy sometime in either 2018 or 2019.  We have none of those records.  He does tell me that his gallbladder was removed at 1 point  His weight is stable he denies having heartburn.  He denies excessive alcohol use or NSAID use.    Review of systems: Pertinent positive and negative review of systems were noted in the above HPI section. All other review negative.   Past Medical History:  Diagnosis Date  . GERD (gastroesophageal reflux disease)   . Hypertension   . Pancreatitis     Past Surgical History:  Procedure Laterality Date  . CHOLECYSTECTOMY      Current Outpatient Medications  Medication Sig Dispense Refill  . amLODipine (NORVASC) 10 MG tablet Take 10 mg by mouth daily.    . insulin aspart (NOVOLOG FLEXPEN) 100 UNIT/ML FlexPen Inject 20 Units into the skin 3 (three) times daily with meals.    . Insulin Glargine (BASAGLAR KWIKPEN) 100 UNIT/ML SOPN Inject 30 Units into the skin at bedtime.    Marland Kitchen lisinopril-hydrochlorothiazide (ZESTORETIC) 20-12.5 MG tablet Take 1 tablet by mouth daily.    . pantoprazole (PROTONIX) 40 MG tablet Take 40 mg by mouth daily.     No current facility-administered medications for this visit.     Allergies as of 01/15/2019  . (No  Known Allergies)    Family History  Problem Relation Age of Onset  . Diabetes Mother   . Heart attack Brother     Social History   Socioeconomic History  . Marital status: Divorced    Spouse name: Not on file  . Number of children: Not on file  . Years of education: Not on file  . Highest education level: Not on file  Occupational History  . Not on file  Social Needs  . Financial resource strain: Not on file  . Food insecurity    Worry: Not on file    Inability: Not on file  . Transportation needs    Medical: Not on file    Non-medical: Not on file  Tobacco Use  . Smoking status: Never Smoker  . Smokeless tobacco: Never Used  Substance and Sexual Activity  . Alcohol use: Not on file    Comment: rarely  . Drug use: Never  . Sexual activity: Not on file  Lifestyle  . Physical activity    Days per week: Not on file    Minutes per session: Not on file  . Stress: Not on file  Relationships  . Social Herbalist on phone: Not on file    Gets together: Not on file    Attends religious service: Not on file    Active member of club or  organization: Not on file    Attends meetings of clubs or organizations: Not on file    Relationship status: Not on file  . Intimate partner violence    Fear of current or ex partner: Not on file    Emotionally abused: Not on file    Physically abused: Not on file    Forced sexual activity: Not on file  Other Topics Concern  . Not on file  Social History Narrative  . Not on file     Physical Exam: BP 118/62   Pulse 70   Temp (!) 97.4 F (36.3 C) (Temporal)   Ht 5\' 10"  (1.778 m)   Wt 212 lb (96.2 kg)   BMI 30.42 kg/m  Constitutional: generally well-appearing Psychiatric: alert and oriented x3 Eyes: extraocular movements intact Mouth: oral pharynx moist, no lesions Neck: supple no lymphadenopathy Cardiovascular: heart regular rate and rhythm Lungs: clear to auscultation bilaterally Abdomen: soft, nontender,  nondistended, no obvious ascites, no peritoneal signs, normal bowel sounds Extremities: no lower extremity edema bilaterally Skin: no lesions on visible extremities   Assessment and plan: 68 y.o. male with possible history of pancreatitis, chronic epigastric pains, probable GERD  First he is Spanish-speaking only and so professional interpreter is used.  As always nuances of the history were "lost in translation".  He brought paperwork describing acute pancreatitis from a [place] hospital.  I am not sure if he had acute pancreatitis or has chronic pancreatitis or was being warned about possibly getting pancreatitis.  He certainly in the room today is complaining of chronic epigastric pains.  We are going to try to get more clear records from 73 where I think he is undergone upper endoscopy and colonoscopy and possibly imaging as well.  We will start his care here by getting basic set of labs including CBC and complete metabolic profile.  He wanted refills on Protonix and he says it helps his upper abdominal pains I am happy to provide refills for him with a prescription.  Given his question of pancreatic disease and complaints of epigastric pain will be helpful to get local imaging some ordering a CT scan abdomen pelvis with IV and oral contrast   Please see the "Patient Instructions" section for addition details about the plan.   [place], MD [facility] 01/15/2019, 3:04 PM  Cc: 01/17/2019

## 2019-01-15 NOTE — Patient Instructions (Signed)
You have been scheduled for a CT scan of the abdomen and pelvis at [facility] are scheduled on 01/22/2019 at 2pm. You should arrive 15 minutes prior to your appointment time for registration. Please follow the written instructions below on the day of your exam:  WARNING: IF YOU ARE ALLERGIC TO IODINE/X-RAY DYE, PLEASE NOTIFY RADIOLOGY IMMEDIATELY AT [phone]! YOU WILL BE GIVEN A 13 HOUR PREMEDICATION PREP.  1) Do not eat or drink anything after 10am (4 hours prior to your test) 2) You have been given 2 bottles of oral contrast to drink. The solution may taste better if refrigerated, but do NOT add ice or any other liquid to this solution. Shake well before drinking.    Drink 1 bottle of contrast @ 12pm (2 hours prior to your exam)  Drink 1 bottle of contrast @ 1pm (1 hour prior to your exam)  You may take any medications as prescribed with a small amount of water, if necessary. If you take any of the following medications: METFORMIN, GLUCOPHAGE, GLUCOVANCE, AVANDAMET, RIOMET, FORTAMET, Soso MET, JANUMET, GLUMETZA or METAGLIP, you MAY be asked to HOLD this medication 48 hours AFTER the exam.  The purpose of you drinking the oral contrast is to aid in the visualization of your intestinal tract. The contrast solution may cause some diarrhea. Depending on your individual set of symptoms, you may also receive an intravenous injection of x-ray contrast/dye. Plan on being at [facility] for 30 minutes or longer, depending on the type of exam you are having performed.  This test typically takes 30-45 minutes to complete.  If you have any questions regarding your exam or if you need to reschedule, you may call the CT department at [phone] between the hours of 8:00 am and 5:00 pm, Monday-Friday.   Your provider has requested that you go to the basement level for lab work before leaving today. Press "B" on the elevator. The lab is located at the first door on the left as  you exit the elevator.  We have sent the following medications to your pharmacy for you to pick up at your convenience: Protonix  Thank you for entrusting me with your care and choosing [facility].  Dr Ardis Hughs   ________________________________________________________________________

## 2019-01-22 ENCOUNTER — Ambulatory Visit (HOSPITAL_COMMUNITY)
Admission: RE | Admit: 2019-01-22 | Discharge: 2019-01-22 | Disposition: A | Discharge status: Home / Self Care | Payer: Medicare HMO | Source: Ambulatory Visit | Attending: Gastroenterology | Admitting: Gastroenterology

## 2019-01-22 ENCOUNTER — Other Ambulatory Visit: Payer: Self-pay

## 2019-01-22 DIAGNOSIS — R1013 Epigastric pain: Secondary | ICD-10-CM | POA: Diagnosis not present

## 2019-01-22 MED ORDER — IOHEXOL 300 MG/ML  SOLN
100.0000 mL | Freq: Once | INTRAMUSCULAR | Status: AC | PRN
  Administered 2019-01-22: 15:00:00 100 mL via INTRAVENOUS

## 2019-01-22 MED ORDER — SODIUM CHLORIDE (PF) 0.9 % IJ SOLN
INTRAMUSCULAR | Status: AC
  Filled 2019-01-22: qty 50

## 2019-01-30 ENCOUNTER — Encounter: Payer: Self-pay | Admitting: Endocrinology

## 2019-01-30 ENCOUNTER — Ambulatory Visit (INDEPENDENT_AMBULATORY_CARE_PROVIDER_SITE_OTHER): Payer: Medicare HMO | Admitting: Endocrinology

## 2019-01-30 VITALS — BP 126/70 | HR 80 | Ht 70.0 in | Wt 214.0 lb

## 2019-01-30 DIAGNOSIS — R079 Chest pain, unspecified: Secondary | ICD-10-CM | POA: Insufficient documentation

## 2019-01-30 DIAGNOSIS — I10 Essential (primary) hypertension: Secondary | ICD-10-CM | POA: Diagnosis not present

## 2019-01-30 DIAGNOSIS — E1040 Type 1 diabetes mellitus with diabetic neuropathy, unspecified: Secondary | ICD-10-CM | POA: Diagnosis not present

## 2019-01-30 LAB — POCT GLYCOSYLATED HEMOGLOBIN (HGB A1C): Hemoglobin A1C: 8.4 % — AB (ref 4.0–5.6)

## 2019-01-30 MED ORDER — BASAGLAR KWIKPEN 100 UNIT/ML ~~LOC~~ SOPN: 25.0000 [IU] | PEN_INJECTOR | Freq: Every day | SUBCUTANEOUS | 11 refills | Status: DC

## 2019-01-30 MED ORDER — NOVOLOG FLEXPEN 100 UNIT/ML ~~LOC~~ SOPN: 20.0000 [IU] | PEN_INJECTOR | Freq: Three times a day (TID) | SUBCUTANEOUS | 11 refills | Status: DC

## 2019-01-30 NOTE — Patient Instructions (Addendum)
good diet and exercise significantly improve the control of your diabetes.  please let me know if you wish to be referred to a dietician.  high blood sugar is very risky to your health.  you should see an eye doctor and dentist every year.  It is very important to get all recommended vaccinations.  Controlling your blood pressure and cholesterol drastically reduces the damage diabetes does to your body.  Those who smoke should quit.  Please discuss these with your doctor.  check your blood sugar twice a day.  vary the time of day when you check, between before the 3 meals, and at bedtime.  also check if you have symptoms of your blood sugar being too high or too low.  please keep a record of the readings and bring it to your next appointment here (or you can bring the meter itself).  You can write it on any piece of paper.  please call us sooner if your blood sugar goes below 70, or if you have a lot of readings over 200. Please see a specialist.  you will receive a phone call, about a day and time for an appointment.   Please increase the novolog to 20 units 3 times a day (just before each meal), and: Decrease the Basaglar to 25 units at bedtime.   Please see Mickel Baas, to consider a continuous glucose monitor.   Please come back for a follow-up appointment in 2 months.    Una buena dieta y el ejercicio mejoran significativamente el control de su diabetes. por favor avseme si desea que lo deriven a un dietista. el nivel alto de azcar en sangre es muy peligroso para su salud. debe consultar a un [place] y un dentista todos los [place]. Es muy importante recibir todas las vacunas recomendadas. Controlar la presin arterial y el colesterol reduce drsticamente el dao que la diabetes le hace a su cuerpo. Aquellos que fuman deben dejar de hacerlo. Discuta esto con su mdico. controle su nivel de azcar en sangre 2 veces al da. Vare la hora del da en que lo comprueba, entre antes de las 3 comidas y antes de  [place]. Compruebe tambin si tiene sntomas de que su nivel de Location manager en sangre es demasiado alto o demasiado bajo. por favor mantenga un registro de las lecturas y Air cabin crew a su prxima cita aqu (o puede traer Nature conservation officer). Puedes escribirlo en cualquier hoja de papel. Llmenos antes si su nivel de azcar en sangre es inferior a 58 o si tiene muchas lecturas superiores a 200. Consulte a un especialista. recibir una llamada telefnica, aproximadamente el da y la hora para una cita. Aumente el novolog a 20 unidades 3 veces al da (justo antes de cada comida) y: [place] Lopes a 25 unidades antes de [place]. Consulte con Mickel Baas para considerar un monitor de glucosa continuo. Regrese para una cita de seguimiento en 2 meses.
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
## 2019-02-03 ENCOUNTER — Ambulatory Visit (INDEPENDENT_AMBULATORY_CARE_PROVIDER_SITE_OTHER): Payer: Medicare HMO | Admitting: Cardiology

## 2019-02-03 ENCOUNTER — Encounter: Payer: Self-pay | Admitting: Cardiology

## 2019-02-03 ENCOUNTER — Other Ambulatory Visit: Payer: Self-pay

## 2019-02-03 VITALS — BP 138/82 | HR 74 | Wt 211.1 lb

## 2019-02-03 DIAGNOSIS — E782 Mixed hyperlipidemia: Secondary | ICD-10-CM

## 2019-02-03 DIAGNOSIS — E785 Hyperlipidemia, unspecified: Secondary | ICD-10-CM | POA: Insufficient documentation

## 2019-02-03 DIAGNOSIS — E1040 Type 1 diabetes mellitus with diabetic neuropathy, unspecified: Secondary | ICD-10-CM | POA: Diagnosis not present

## 2019-02-03 DIAGNOSIS — Z1329 Encounter for screening for other suspected endocrine disorder: Secondary | ICD-10-CM

## 2019-02-03 DIAGNOSIS — I10 Essential (primary) hypertension: Secondary | ICD-10-CM

## 2019-02-03 DIAGNOSIS — E7800 Pure hypercholesterolemia, unspecified: Secondary | ICD-10-CM

## 2019-02-03 DIAGNOSIS — R0789 Other chest pain: Secondary | ICD-10-CM

## 2019-02-03 MED ORDER — ATORVASTATIN CALCIUM 10 MG PO TABS: 10.0000 mg | ORAL_TABLET | Freq: Every day | ORAL | 3 refills | Status: DC

## 2019-02-03 NOTE — Progress Notes (Signed)
Cardiology Office Note:    Date:  02/03/2019   ID:  Marco Payne, DOB [birth year], MRN [id number]  PCP:  Mliss Sax, MD  Cardiologist:  Garwin Brothers, MD   Referring MD: Romero Belling, MD    ASSESSMENT:    1. Essential hypertension   2. Type 1 diabetes mellitus with diabetic neuropathy, unspecified (HCC)   3. Elevated LDL cholesterol level   4. Chest discomfort   5. Mixed dyslipidemia    PLAN:    In order of problems listed above:  1. Chest discomfort: Atypical for coronary artery disease.  I agree with primary care physician assessment.  In view of this I will await records from [place].  In the interim if he has any significant issues he knows to go to the nearest emergency room. 2. Essential hypertension: Blood pressure stable 3. Diabetes mellitus and mixed dyslipidemia: Patient is unclear as to why he is not on statin therapy.  He denies any having any problems in the past.  I reviewed recent lipids and they are elevated.  Liver tests are fine.  He will be initiated on atorvastatin 10 mg daily.  He will be back in 1 month before which he will come a day before for blood work including lipids 4. Importance of regular exercise stressed and he vocalized understanding.   Medication Adjustments/Labs and Tests Ordered: Current medicines are reviewed at length with the patient today.  Concerns regarding medicines are outlined above.  No orders of the defined types were placed in this encounter.  No orders of the defined types were placed in this encounter.    History of Present Illness:    Marco Payne is a 68 y.o. male who is being seen today for the evaluation of chest discomfort at the request of Romero Belling, MD.  Patient is a pleasant 68 year old male.  He has past medical history of essential hypertension and diabetes mellitus and dyslipidemia.  Patient mentions to me that he occasionally has chest discomfort.  Last year he says he was in [place] and  underwent coronary angiography and was told that it was fine.  He did not need any intervention.  He walks on a regular basis.  He walks 30 to 35 minutes yesterday without any problems.  At the time of my evaluation, the patient is alert awake oriented and in no distress.  Past Medical History:  Diagnosis Date  . GERD (gastroesophageal reflux disease)   . Hypertension   . Pancreatitis     Past Surgical History:  Procedure Laterality Date  . CHOLECYSTECTOMY      Current Medications: Current Meds  Medication Sig  . amLODipine (NORVASC) 10 MG tablet Take 10 mg by mouth daily.  . B-D UF III MINI PEN NEEDLES 31G X 5 MM MISC   . insulin aspart (NOVOLOG FLEXPEN) 100 UNIT/ML FlexPen Inject 20 Units into the skin 3 (three) times daily with meals. And pen needles 4/day  . Insulin Glargine (BASAGLAR KWIKPEN) 100 UNIT/ML SOPN Inject 0.25 mLs (25 Units total) into the skin at bedtime.  Marland Kitchen lisinopril-hydrochlorothiazide (ZESTORETIC) 20-12.5 MG tablet Take 1 tablet by mouth daily.  Marland Kitchen omeprazole (PRILOSEC) 40 MG capsule Take 40 mg by mouth daily.  . pantoprazole (PROTONIX) 40 MG tablet Take 1 tablet (40 mg total) by mouth daily.     Allergies:   Patient has no known allergies.   Social History   Socioeconomic History  . Marital status: Divorced    Spouse name: Not  on file  . Number of children: Not on file  . Years of education: Not on file  . Highest education level: Not on file  Occupational History  . Not on file  Social Needs  . Financial resource strain: Not on file  . Food insecurity    Worry: Not on file    Inability: Not on file  . Transportation needs    Medical: Not on file    Non-medical: Not on file  Tobacco Use  . Smoking status: Never Smoker  . Smokeless tobacco: Never Used  Substance and Sexual Activity  . Alcohol use: Not on file    Comment: rarely  . Drug use: Never  . Sexual activity: Not on file  Lifestyle  . Physical activity    Days per week: Not on file     Minutes per session: Not on file  . Stress: Not on file  Relationships  . Social Herbalist on phone: Not on file    Gets together: Not on file    Attends religious service: Not on file    Active member of club or organization: Not on file    Attends meetings of clubs or organizations: Not on file    Relationship status: Not on file  Other Topics Concern  . Not on file  Social History Narrative  . Not on file     Family History: The patient's family history includes Diabetes in his mother; Heart attack in his brother.  ROS:   Please see the history of present illness.    All other systems reviewed and are negative.  EKGs/Labs/Other Studies Reviewed:    The following studies were reviewed today: EKG reveals sinus rhythm and nonspecific ST-T changes   Recent Labs: 01/15/2019: ALT 16; BUN 29; Creatinine, Ser 1.27; Hemoglobin 14.3; Platelets 176.0; Potassium 4.3; Sodium 140  Recent Lipid Panel    Component Value Date/Time   CHOL 213 (H) 12/06/2018 1007   TRIG 249.0 (H) 12/06/2018 1007   HDL 34.10 (L) 12/06/2018 1007   CHOLHDL 6 12/06/2018 1007   VLDL 49.8 (H) 12/06/2018 1007   LDLDIRECT 129.0 12/06/2018 1007    Physical Exam:    VS:  BP 138/82 (BP Location: Left Arm, Patient Position: Sitting, Cuff Size: Normal)   Pulse 74   Wt 211 lb 1.3 oz (95.7 kg)   SpO2 96%   BMI 30.29 kg/m     Wt Readings from Last 3 Encounters:  02/03/19 211 lb 1.3 oz (95.7 kg)  01/30/19 214 lb (97.1 kg)  01/15/19 212 lb (96.2 kg)     GEN: Patient is in no acute distress HEENT: Normal NECK: No JVD; No carotid bruits LYMPHATICS: No lymphadenopathy CARDIAC: S1 S2 regular, 2/6 systolic murmur at the apex. RESPIRATORY:  Clear to auscultation without rales, wheezing or rhonchi  ABDOMEN: Soft, non-tender, non-distended MUSCULOSKELETAL:  No edema; No deformity  SKIN: Warm and dry NEUROLOGIC:  Alert and oriented x 3 PSYCHIATRIC:  Normal affect    Signed, Jenean Lindau,  MD  02/03/2019 11:09 AM    [place]

## 2019-02-03 NOTE — Patient Instructions (Signed)
Medication Instructions:  Your physician has recommended you make the following change in your medication:   START taking atorvastatin 10 mg(1 tablet) once daily  *If you need a refill on your cardiac medications before your next appointment, please call your pharmacy*  Lab Work: Your physician recommends that you return FASTING in 1 month for a BMP, TSH, Hepatic and lipid  If you have labs (blood work) drawn today and your tests are completely normal, you will receive your results only by: Marland Kitchen MyChart Message (if you have MyChart) OR . A paper copy in the mail If you have any lab test that is abnormal or we need to change your treatment, we will call you to review the results.  Testing/Procedures: You had an EKG performed today   Follow-Up: At [facility], you and your health needs are our priority.  As part of our continuing mission to provide you with exceptional heart care, we have created designated Provider Care Teams.  These Care Teams include your primary Cardiologist (physician) and Advanced Practice Providers (APPs -  Physician Assistants and Nurse Practitioners) who all work together to provide you with the care you need, when you need it.  Your next appointment:   1 month  The format for your next appointment:   In Person  Provider:   Jyl Heinz, MD  Other Instructions Atorvastatin tablets What is this medicine? ATORVASTATIN (a TORE va sta tin) is known as a HMG-CoA reductase inhibitor or 'statin'. It lowers the level of cholesterol and triglycerides in the blood. This drug may also reduce the risk of heart attack, stroke, or other health problems in patients with risk factors for heart disease. Diet and lifestyle changes are often used with this drug. This medicine may be used for other purposes; ask your health care provider or pharmacist if you have questions. COMMON BRAND NAME(S): Lipitor What should I tell my health care provider before I take this  medicine? They need to know if you have any of these conditions:  diabetes  if you often drink alcohol  history of stroke  kidney disease  liver disease  muscle aches or weakness  thyroid disease  an unusual or allergic reaction to atorvastatin, other medicines, foods, dyes, or preservatives  pregnant or trying to get pregnant  breast-feeding How should I use this medicine? Take this medicine by mouth with a glass of water. Follow the directions on the prescription label. You can take it with or without food. If it upsets your stomach, take it with food. Do not take with grapefruit juice. Take your medicine at regular intervals. Do not take it more often than directed. Do not stop taking except on your doctor's advice. Talk to your pediatrician regarding the use of this medicine in children. While this drug may be prescribed for children as young as 10 for selected conditions, precautions do apply. Overdosage: If you think you have taken too much of this medicine contact a poison control center or emergency room at once. NOTE: This medicine is only for you. Do not share this medicine with others. What if I miss a dose? If you miss a dose, take it as soon as you can. If your next dose is to be taken in less than 12 hours, then do not take the missed dose. Take the next dose at your regular time. Do not take double or extra doses. What may interact with this medicine? Do not take this medicine with any of the following medications:  dasabuvir; ombitasvir; paritaprevir; ritonavir  ombitasvir; paritaprevir; ritonavir  posaconazole  red yeast rice This medicine may also interact with the following medications:  alcohol  birth control pills  certain antibiotics like erythromycin and clarithromycin  certain antivirals for HIV or hepatitis  certain medicines for cholesterol like fenofibrate, gemfibrozil, and niacin  certain medicines for fungal infections like ketoconazole  and itraconazole  colchicine  cyclosporine  digoxin  grapefruit juice  rifampin This list may not describe all possible interactions. Give your health care provider a list of all the medicines, herbs, non-prescription drugs, or dietary supplements you use. Also tell them if you smoke, drink alcohol, or use illegal drugs. Some items may interact with your medicine. What should I watch for while using this medicine? Visit your doctor or health care professional for regular check-ups. You may need regular tests to make sure your liver is working properly. Your health care professional may tell you to stop taking this medicine if you develop muscle problems. If your muscle problems do not go away after stopping this medicine, contact your health care professional. Do not become pregnant while taking this medicine. Women should inform their health care professional if they wish to become pregnant or think they might be pregnant. There is a potential for serious side effects to an unborn child. Talk to your health care professional or pharmacist for more information. Do not breast-feed an infant while taking this medicine. This medicine may increase blood sugar. Ask your healthcare provider if changes in diet or medicines are needed if you have diabetes. If you are going to need surgery or other procedure, tell your doctor that you are using this medicine. This drug is only part of a total heart-health program. Your doctor or a dietician can suggest a low-cholesterol and low-fat diet to help. Avoid alcohol and smoking, and keep a proper exercise schedule. This medicine may cause a decrease in Co-Enzyme Q-10. You should make sure that you get enough Co-Enzyme Q-10 while you are taking this medicine. Discuss the foods you eat and the vitamins you take with your health care professional. What side effects may I notice from receiving this medicine? Side effects that you should report to your doctor or  health care professional as soon as possible:  allergic reactions like skin rash, itching or hives, swelling of the face, lips, or tongue  fever  joint pain  loss of memory  redness, blistering, peeling or loosening of the skin, including inside the mouth  signs and symptoms of high blood sugar such as being more thirsty or hungry or having to urinate more than normal. You may also feel very tired or have blurry vision.  signs and symptoms of liver injury like dark yellow or brown urine; general ill feeling or flu-like symptoms; light-belly pain; unusually weak or tired; yellowing of the eyes or skin  signs and symptoms of muscle injury like dark urine; trouble passing urine or change in the amount of urine; unusually weak or tired; muscle pain or side or back pain Side effects that usually do not require medical attention (report to your doctor or health care professional if they continue or are bothersome):  diarrhea  nausea  stomach pain  trouble sleeping  upset stomach This list may not describe all possible side effects. Call your doctor for medical advice about side effects. You may report side effects to FDA at 1-800-FDA-1088. Where should I keep my medicine? Keep out of the reach of children. Store between 20  and 25 degrees C (68 and 77 degrees F). Throw away any unused medicine after the expiration date. NOTE: This sheet is a summary. It may not cover all possible information. If you have questions about this medicine, talk to your doctor, pharmacist, or health care provider.  2020 Elsevier/Gold Standard (2017-12-26 11:36:16)

## 2019-02-10 ENCOUNTER — Telehealth: Payer: Self-pay | Admitting: Gastroenterology

## 2019-02-10 NOTE — Telephone Encounter (Signed)
"

## 2019-02-10 NOTE — Telephone Encounter (Signed)
I have mailed a letter and ask that he return call to discuss.

## 2019-02-10 NOTE — Telephone Encounter (Signed)
Message left via interpreter for the pt to return call

## 2019-02-12 ENCOUNTER — Telehealth: Payer: Self-pay | Admitting: Endocrinology

## 2019-02-12 MED ORDER — LANTUS SOLOSTAR 100 UNIT/ML ~~LOC~~ SOPN: 25.0000 [IU] | PEN_INJECTOR | Freq: Every day | SUBCUTANEOUS | 99 refills | Status: DC

## 2019-02-12 NOTE — Telephone Encounter (Signed)
please contact patient: Ins wants you to change basaglar to Lantus.  Dosage is the same.  I have sent a prescription to your pharmacy.  I'll see you next time.

## 2019-02-17 ENCOUNTER — Telehealth: Payer: Self-pay | Admitting: Gastroenterology

## 2019-02-17 DIAGNOSIS — R918 Other nonspecific abnormal finding of lung field: Secondary | ICD-10-CM

## 2019-02-17 NOTE — Telephone Encounter (Signed)
Spoke with pts daughter asked her to have pt call us back when he gets in

## 2019-02-18 NOTE — Telephone Encounter (Signed)
Spoke with patient he's aware and ok with changed and will pick up Rx

## 2019-02-19 NOTE — Telephone Encounter (Signed)
Please call the patient. The CT does NOT explain his epigastric pains. Lobular liver may reflect underlying cirrhosis. I recommend EGD to continue that workup for his epigastric pain. The CT scan DOES show an area of his left lung that is not normal and because of this he needs a pulmonary referral. Let him know it is not obviously a cancer. thanks

## 2019-02-19 NOTE — Telephone Encounter (Signed)
Left message on machine to call back using interpreter services  

## 2019-02-19 NOTE — Telephone Encounter (Signed)
"  EGD January 2020 Dr. Natalia Leatherwood at "[facility] in [place].  Indications "heartburn".  Findings 3 cm hiatal hernia.  Diffuse severe inflammation with erosions and erythema throughout his stomach biopsies were taken normal duodenum pathology report described "chronic gastritis and intestinal metaplasia.  Negative for H. Pylori."   It looks like we were never able to get in touch with him after his CT scan, see that result note for recommendations about the scan.  If you could please again try to get in touch with him I do still recommend he have an upper endoscopy at his soonest convenience in addition to pulmonary consultation for the lung findings."

## 2019-02-25 NOTE — Telephone Encounter (Signed)
I spoke with the pt via interpreter service and he has been scheduled for EGD, previsit and COVID test as well as pulmonary referral made.  The pt has also been mailed a copy of the appts.

## 2019-03-03 DIAGNOSIS — E7800 Pure hypercholesterolemia, unspecified: Secondary | ICD-10-CM | POA: Diagnosis not present

## 2019-03-03 DIAGNOSIS — E782 Mixed hyperlipidemia: Secondary | ICD-10-CM | POA: Diagnosis not present

## 2019-03-03 DIAGNOSIS — Z1329 Encounter for screening for other suspected endocrine disorder: Secondary | ICD-10-CM | POA: Diagnosis not present

## 2019-03-03 DIAGNOSIS — R0789 Other chest pain: Secondary | ICD-10-CM | POA: Diagnosis not present

## 2019-03-03 DIAGNOSIS — E1040 Type 1 diabetes mellitus with diabetic neuropathy, unspecified: Secondary | ICD-10-CM | POA: Diagnosis not present

## 2019-03-03 DIAGNOSIS — I10 Essential (primary) hypertension: Secondary | ICD-10-CM | POA: Diagnosis not present

## 2019-03-04 LAB — BASIC METABOLIC PANEL
BUN/Creatinine Ratio: 16 (ref 10–24)
BUN: 22 mg/dL (ref 8–27)
CO2: 23 mmol/L (ref 20–29)
Calcium: 9.5 mg/dL (ref 8.6–10.2)
Chloride: 102 mmol/L (ref 96–106)
Creatinine, Ser: 1.35 mg/dL — ABNORMAL HIGH (ref 0.76–1.27)
GFR calc Af Amer: 63 mL/min/{1.73_m2} (ref >59)
GFR calc non Af Amer: 54 mL/min/{1.73_m2} — ABNORMAL LOW (ref >59)
Glucose: 234 mg/dL — ABNORMAL HIGH (ref 65–99)
Potassium: 4.3 mmol/L (ref 3.5–5.2)
Sodium: 140 mmol/L (ref 134–144)

## 2019-03-04 LAB — LIPID PANEL
Chol/HDL Ratio: 3.4 ratio (ref 0.0–5.0)
Cholesterol, Total: 129 mg/dL (ref 100–199)
HDL: 38 mg/dL — ABNORMAL LOW (ref >39)
LDL Chol Calc (NIH): 58 mg/dL (ref 0–99)
Triglycerides: 198 mg/dL — ABNORMAL HIGH (ref 0–149)
VLDL Cholesterol Cal: 33 mg/dL (ref 5–40)

## 2019-03-04 LAB — HEPATIC FUNCTION PANEL
ALT: 21 IU/L (ref 0–44)
AST: 26 IU/L (ref 0–40)
Albumin: 4.5 g/dL (ref 3.8–4.8)
Alkaline Phosphatase: 98 IU/L (ref 39–117)
Bilirubin Total: 0.6 mg/dL (ref 0.0–1.2)
Bilirubin, Direct: 0.15 mg/dL (ref 0.00–0.40)
Total Protein: 7.5 g/dL (ref 6.0–8.5)

## 2019-03-04 LAB — TSH: TSH: 1.23 u[IU]/mL (ref 0.450–4.500)

## 2019-03-05 ENCOUNTER — Encounter: Payer: Self-pay | Admitting: Cardiology

## 2019-03-05 ENCOUNTER — Ambulatory Visit (INDEPENDENT_AMBULATORY_CARE_PROVIDER_SITE_OTHER): Payer: Medicare HMO | Admitting: Cardiology

## 2019-03-05 ENCOUNTER — Other Ambulatory Visit: Payer: Self-pay

## 2019-03-05 VITALS — BP 158/90 | HR 74 | Ht 70.0 in | Wt 215.0 lb

## 2019-03-05 DIAGNOSIS — E782 Mixed hyperlipidemia: Secondary | ICD-10-CM | POA: Diagnosis not present

## 2019-03-05 DIAGNOSIS — R0789 Other chest pain: Secondary | ICD-10-CM | POA: Diagnosis not present

## 2019-03-05 DIAGNOSIS — E1040 Type 1 diabetes mellitus with diabetic neuropathy, unspecified: Secondary | ICD-10-CM

## 2019-03-05 DIAGNOSIS — I10 Essential (primary) hypertension: Secondary | ICD-10-CM | POA: Diagnosis not present

## 2019-03-05 NOTE — Progress Notes (Signed)
Cardiology Office Note:    Date:  03/05/2019   ID:  Marco Payne, DOB January 24, 1951, MRN [id number]  PCP:  Mliss Sax, MD  Cardiologist:  Garwin Brothers, MD   Referring MD: Mliss Sax,*    ASSESSMENT:    1. Essential hypertension   2. Type 1 diabetes mellitus with diabetic neuropathy, unspecified (HCC)   3. Mixed dyslipidemia   4. Chest discomfort    PLAN:    In order of problems listed above:  1. Chest discomfort: This has resolved and he has excellent effort tolerance.  He will continue to exercise and lose weight.  He has abdominal obesity. 2. Essential hypertension: His blood pressure stable.  Blood pressure at home is much better according to the patient.  Salt intake issues were discussed with the patient 3. Mixed dyslipidemia and diabetes mellitus: Blood work was reviewed with the patient.  Diabetes managed by primary care physician. 4. We will try to get a copy of his cardiac evaluation from his doctor in [place].  He is going to give [place] the doctor's information. 5. Patient will be seen in follow-up appointment in 6 months or earlier if the patient has any concerns    Medication Adjustments/Labs and Tests Ordered: Current medicines are reviewed at length with the patient today.  Concerns regarding medicines are outlined above.  No orders of the defined types were placed in this encounter.  No orders of the defined types were placed in this encounter.    Chief Complaint  Patient presents with  . Follow-up     History of Present Illness:    Marco Payne is a 68 y.o. male.  Patient has past medical history of essential hypertension.  He has undergone coronary evaluation in the ER.  We are still to receive those reports.  He is going to now give [place] a copy of his doctor's name and number and will try to this time.  Patient denies any problems at this time he walks about 45 minutes on a daily basis.  No chest pain orthopnea or PND.  At the  time of my evaluation, the patient is alert awake oriented and in no distress.  Past Medical History:  Diagnosis Date  . GERD (gastroesophageal reflux disease)   . Hypertension   . Pancreatitis     Past Surgical History:  Procedure Laterality Date  . CHOLECYSTECTOMY      Current Medications: Current Meds  Medication Sig  . amLODipine (NORVASC) 10 MG tablet Take 10 mg by mouth daily.  Marland Kitchen atorvastatin (LIPITOR) 10 MG tablet Take 1 tablet (10 mg total) by mouth daily.  . B-D UF III MINI PEN NEEDLES 31G X 5 MM MISC   . insulin aspart (NOVOLOG FLEXPEN) 100 UNIT/ML FlexPen Inject 20 Units into the skin 3 (three) times daily with meals. And pen needles 4/day  . Insulin Glargine (LANTUS SOLOSTAR) 100 UNIT/ML Solostar Pen Inject 25 Units into the skin daily.  Marland Kitchen lisinopril-hydrochlorothiazide (ZESTORETIC) 20-12.5 MG tablet Take 1 tablet by mouth daily.  Marland Kitchen omeprazole (PRILOSEC) 40 MG capsule Take 40 mg by mouth daily.  . pantoprazole (PROTONIX) 40 MG tablet Take 1 tablet (40 mg total) by mouth daily.     Allergies:   Patient has no known allergies.   Social History   Socioeconomic History  . Marital status: Divorced    Spouse name: Not on file  . Number of children: Not on file  . Years of education: Not on file  .  Highest education level: Not on file  Occupational History  . Not on file  Tobacco Use  . Smoking status: Never Smoker  . Smokeless tobacco: Never Used  Substance and Sexual Activity  . Alcohol use: Not on file    Comment: rarely  . Drug use: Never  . Sexual activity: Not on file  Other Topics Concern  . Not on file  Social History Narrative  . Not on file   Social Determinants of Health   Financial Resource Strain:   . Difficulty of Paying Living Expenses: Not on file  Food Insecurity:   . Worried About Charity fundraiser in the Last Year: Not on file  . Ran Out of Food in the Last Year: Not on file  Transportation Needs:   . Lack of Transportation  (Medical): Not on file  . Lack of Transportation (Non-Medical): Not on file  Physical Activity:   . Days of Exercise per Week: Not on file  . Minutes of Exercise per Session: Not on file  Stress:   . Feeling of Stress : Not on file  Social Connections:   . Frequency of Communication with Friends and Family: Not on file  . Frequency of Social Gatherings with Friends and Family: Not on file  . Attends Religious Services: Not on file  . Active Member of Clubs or Organizations: Not on file  . Attends Archivist Meetings: Not on file  . Marital Status: Not on file     Family History: The patient's family history includes Diabetes in his mother; Heart attack in his brother.  ROS:   Please see the history of present illness.    All other systems reviewed and are negative.  EKGs/Labs/Other Studies Reviewed:    The following studies were reviewed today: I reviewed lab work with the patient at length.   Recent Labs: 01/15/2019: Hemoglobin 14.3; Platelets 176.0 03/03/2019: ALT 21; BUN 22; Creatinine, Ser 1.35; Potassium 4.3; Sodium 140; TSH 1.230  Recent Lipid Panel    Component Value Date/Time   CHOL 129 03/03/2019 0930   TRIG 198 (H) 03/03/2019 0930   HDL 38 (L) 03/03/2019 0930   CHOLHDL 3.4 03/03/2019 0930   CHOLHDL 6 12/06/2018 1007   VLDL 49.8 (H) 12/06/2018 1007   LDLCALC 58 03/03/2019 0930   LDLDIRECT 129.0 12/06/2018 1007    Physical Exam:    VS:  BP (!) 158/90 (BP Location: Right Arm, Patient Position: Sitting, Cuff Size: Normal)   Pulse 74   Ht 5\' 10"  (1.778 m)   Wt 215 lb (97.5 kg)   SpO2 94%   BMI 30.85 kg/m     Wt Readings from Last 3 Encounters:  03/05/19 215 lb (97.5 kg)  02/03/19 211 lb 1.3 oz (95.7 kg)  01/30/19 214 lb (97.1 kg)     GEN: Patient is in no acute distress HEENT: Normal NECK: No JVD; No carotid bruits LYMPHATICS: No lymphadenopathy CARDIAC: Hear sounds regular, 2/6 systolic murmur at the apex. RESPIRATORY:  Clear to  auscultation without rales, wheezing or rhonchi  ABDOMEN: Soft, non-tender, non-distended MUSCULOSKELETAL:  No edema; No deformity  SKIN: Warm and dry NEUROLOGIC:  Alert and oriented x 3 PSYCHIATRIC:  Normal affect   Signed, Jenean Lindau, MD  03/05/2019 4:34 PM    [facility]

## 2019-03-05 NOTE — Patient Instructions (Signed)
Medication Instructions:  Your physician recommends that you continue on your current medications as directed. Please refer to the Current Medication list given to you today.  *If you need a refill on your cardiac medications before your next appointment, please call your pharmacy*  Lab Work: None Ordered  Testing/Procedures: None ordered  Follow-Up: At [facility], you and your health needs are our priority.  As part of our continuing mission to provide you with exceptional heart care, we have created designated Provider Care Teams.  These Care Teams include your primary Cardiologist (physician) and Advanced Practice Providers (APPs -  Physician Assistants and Nurse Practitioners) who all work together to provide you with the care you need, when you need it.  Your next appointment:   6 month(s)  The format for your next appointment:   In Person  Provider:   Jyl Heinz, MD

## 2019-03-07 ENCOUNTER — Ambulatory Visit: Payer: Medicare HMO | Admitting: Family Medicine

## 2019-03-07 ENCOUNTER — Other Ambulatory Visit: Payer: Self-pay

## 2019-03-11 ENCOUNTER — Ambulatory Visit: Payer: Medicare HMO | Attending: Internal Medicine

## 2019-03-11 ENCOUNTER — Encounter: Payer: Self-pay | Admitting: *Deleted

## 2019-03-11 ENCOUNTER — Telehealth: Payer: Self-pay | Admitting: *Deleted

## 2019-03-11 DIAGNOSIS — Z20828 Contact with and (suspected) exposure to other viral communicable diseases: Secondary | ICD-10-CM | POA: Diagnosis not present

## 2019-03-11 DIAGNOSIS — Z20822 Contact with and (suspected) exposure to covid-19: Secondary | ICD-10-CM

## 2019-03-11 NOTE — Telephone Encounter (Signed)
-----   Message from Jenean Lindau, MD sent at 03/04/2019  8:23 AM EST ----- Lipids are fine.  Triglycerides are elevated.  Diet low in carbohydrates and fatty foods and liver lipid check in 3 months.  Exercise.  Copy to primary care Jenean Lindau, MD 03/04/2019 8:22 AM

## 2019-03-11 NOTE — Telephone Encounter (Signed)
Telephone call to patient. Spoke with daughter who speaks better [place] than patient. Informed of lab results and need to repeat lidid and liver in 3 months. Daughter verbalized understanding.

## 2019-03-13 ENCOUNTER — Telehealth: Payer: Self-pay | Admitting: Family Medicine

## 2019-03-13 LAB — NOVEL CORONAVIRUS, NAA: SARS-CoV-2, NAA: NOT DETECTED

## 2019-03-13 NOTE — Telephone Encounter (Signed)
Patient called in and received his negative covid test result  °

## 2019-03-20 ENCOUNTER — Ambulatory Visit (AMBULATORY_SURGERY_CENTER): Payer: Medicare HMO

## 2019-03-20 ENCOUNTER — Other Ambulatory Visit: Payer: Self-pay

## 2019-03-20 VITALS — Temp 98.0°F | Ht 70.0 in | Wt 209.8 lb

## 2019-03-20 DIAGNOSIS — R1013 Epigastric pain: Secondary | ICD-10-CM

## 2019-03-20 DIAGNOSIS — Z1159 Encounter for screening for other viral diseases: Secondary | ICD-10-CM

## 2019-03-20 NOTE — Progress Notes (Signed)
No egg or soy allergy known to patient  No issues with past sedation with any surgeries  or procedures, no intubation problems  No diet pills per patient No home 02 use per patient  No blood thinners per patient  Pt denies issues with constipation  No A fib or A flutter  EMMI video sent to pt's e mail   previsit with interpreter,   pt unsure if his daughter can get off work to be his care partner, due to the holiday, I asked him to let us know by 9 am on Monday morning if he needed to cancel to avoid the fee.  Pt verb understanding through interpreter of all instructions.  Due to the COVID-19 pandemic we are asking patients to follow these guidelines. Please only bring one care partner. Please be aware that your care partner may wait in the car in the parking lot or if they feel like they will be too hot to wait in the car, they may wait in the lobby on the 4th floor. All care partners are required to wear a mask the entire time (we do not have any that we can provide them), they need to practice social distancing, and we will do a Covid check for all patient's and care partners when you arrive. Also we will check their temperature and your temperature. If the care partner waits in their car they need to stay in the parking lot the entire time and we will call them on their cell phone when the patient is ready for discharge so they can bring the car to the front of the building. Also all patient's will need to wear a mask into building.

## 2019-03-24 ENCOUNTER — Ambulatory Visit (INDEPENDENT_AMBULATORY_CARE_PROVIDER_SITE_OTHER): Payer: Medicare HMO

## 2019-03-24 DIAGNOSIS — Z1159 Encounter for screening for other viral diseases: Secondary | ICD-10-CM

## 2019-03-25 ENCOUNTER — Telehealth: Payer: Self-pay | Admitting: Gastroenterology

## 2019-03-25 LAB — SARS CORONAVIRUS 2 (TAT 6-24 HRS): SARS Coronavirus 2: NEGATIVE

## 2019-03-25 NOTE — Telephone Encounter (Signed)
Thank you Marco Payne! So I do not need to do anything further is that correct?

## 2019-03-25 NOTE — Telephone Encounter (Signed)
Dr. Glade Nurse called back to my cell phone and we eventually spoke.  He has authorized the upper endoscopy.  He did not give me any authorization number, it sounded like he was working it out on their end.  Thank you

## 2019-03-25 NOTE — Telephone Encounter (Signed)
Spoke with [facility] @ [employer]# [id number] valid 03/26/2019 - 04/25/2019.

## 2019-03-25 NOTE — Telephone Encounter (Signed)
I called the provided number.  It went to voicemail which confirmed I called the provided number however I am not sure if it was correct.  The outgoing message did not mention anything about peer review or that it was Dr. Glade Nurse.  I left my cell phone information as a call back.  Patty, Can you check on this peer to peer review information?  It was a weird outgoing voicemail message and I am not sure if it was the correct number.  Thanks

## 2019-03-25 NOTE — Telephone Encounter (Signed)
Erie Noe can you please review and advise

## 2019-03-26 ENCOUNTER — Encounter: Payer: Medicare HMO | Admitting: Gastroenterology

## 2019-03-27 ENCOUNTER — Institutional Professional Consult (permissible substitution): Payer: Medicare HMO | Admitting: Internal Medicine

## 2019-03-28 ENCOUNTER — Encounter: Payer: Medicare HMO | Admitting: Dietician

## 2019-04-01 ENCOUNTER — Ambulatory Visit: Payer: Medicare HMO | Admitting: Endocrinology

## 2019-04-02 ENCOUNTER — Encounter: Payer: Medicare HMO | Admitting: Gastroenterology

## 2019-04-04 ENCOUNTER — Other Ambulatory Visit: Payer: Self-pay | Admitting: Gastroenterology

## 2019-04-04 ENCOUNTER — Ambulatory Visit (INDEPENDENT_AMBULATORY_CARE_PROVIDER_SITE_OTHER): Payer: Medicare HMO

## 2019-04-04 DIAGNOSIS — Z1159 Encounter for screening for other viral diseases: Secondary | ICD-10-CM

## 2019-04-04 LAB — SARS CORONAVIRUS 2 (TAT 6-24 HRS): SARS Coronavirus 2: NEGATIVE

## 2019-04-07 ENCOUNTER — Encounter: Payer: Self-pay | Admitting: Gastroenterology

## 2019-04-07 ENCOUNTER — Other Ambulatory Visit: Payer: Self-pay

## 2019-04-07 ENCOUNTER — Ambulatory Visit: Payer: Medicare HMO | Admitting: Family Medicine

## 2019-04-07 ENCOUNTER — Ambulatory Visit (AMBULATORY_SURGERY_CENTER): Payer: Medicare HMO | Admitting: Gastroenterology

## 2019-04-07 VITALS — BP 103/71 | HR 73 | Temp 98.2°F | Resp 14 | Ht 70.0 in | Wt 209.8 lb

## 2019-04-07 DIAGNOSIS — R1013 Epigastric pain: Secondary | ICD-10-CM

## 2019-04-07 DIAGNOSIS — K2990 Gastroduodenitis, unspecified, without bleeding: Secondary | ICD-10-CM

## 2019-04-07 DIAGNOSIS — K3189 Other diseases of stomach and duodenum: Secondary | ICD-10-CM

## 2019-04-07 DIAGNOSIS — K2970 Gastritis, unspecified, without bleeding: Secondary | ICD-10-CM

## 2019-04-07 DIAGNOSIS — K219 Gastro-esophageal reflux disease without esophagitis: Secondary | ICD-10-CM | POA: Diagnosis not present

## 2019-04-07 DIAGNOSIS — R140 Abdominal distension (gaseous): Secondary | ICD-10-CM | POA: Diagnosis not present

## 2019-04-07 DIAGNOSIS — K2950 Unspecified chronic gastritis without bleeding: Secondary | ICD-10-CM | POA: Diagnosis not present

## 2019-04-07 MED ORDER — SODIUM CHLORIDE 0.9 % IV SOLN: 500.0000 mL | Freq: Once | INTRAVENOUS | Status: DC

## 2019-04-07 NOTE — Progress Notes (Signed)
Called to room to assist during endoscopic procedure.  Patient ID and intended procedure confirmed with present staff. Received instructions for my participation in the procedure from the performing physician.  

## 2019-04-07 NOTE — Progress Notes (Signed)
Interpreter used today at the [facility] for this pt.  Interpreter's name is-Eduardo Sobalvarro

## 2019-04-07 NOTE — Op Note (Signed)
[facility] Patient Name: Marco Payne Procedure Date: 04/07/2019 9:46 AM MRN: [id number] Endoscopist: Milus Banister , MD Age: 69 Referring MD:  Date of Birth: 02/23/1951 Gender: Male Account #: [id number] Procedure:                Upper GI endoscopy Indications:              Epigastric abdominal pain; EGD January 2020 Dr.                            Natalia Leatherwood at "[facility] in [place]. Indications "heartburn".                            Findings 3 cm hiatal hernia. Diffuse severe                            inflammation with erosions and erythema throughout                            his stomach biopsies were taken normal duodenum                            pathology report described "chronic gastritis and                            intestinal metaplasia. Negative for H. Pylori." Medicines:                Monitored Anesthesia Care Procedure:                Pre-Anesthesia Assessment:                           - Prior to the procedure, a History and Physical                            was performed, and patient medications and                            allergies were reviewed. The patient's tolerance of                            previous anesthesia was also reviewed. The risks                            and benefits of the procedure and the sedation                            options and risks were discussed with the patient.                            All questions were answered, and informed consent  was obtained. Prior Anticoagulants: The patient has                            taken no previous anticoagulant or antiplatelet                            agents. ASA Grade Assessment: II - A patient with                            mild systemic disease. After reviewing the risks                            and benefits, the patient was deemed in                            satisfactory  condition to undergo the procedure.                           After obtaining informed consent, the endoscope was                            passed under direct vision. Throughout the                            procedure, the patient's blood pressure, pulse, and                            oxygen saturations were monitored continuously. The                            Endoscope was introduced through the mouth, and                            advanced to the second part of duodenum. The upper                            GI endoscopy was accomplished without difficulty.                            The patient tolerated the procedure well. Scope In: Scope Out: Findings:                 Diffuse severe inflammation characterized by                            erythema, friability and granularity was found in                            the entire examined stomach. Biopsies were taken                            with a cold forceps for histology.  The exam was otherwise without abnormality. Complications:            No immediate complications. Estimated blood loss:                            None. Estimated Blood Loss:     Estimated blood loss: none. Impression:               - Moderate to severe pan-gastritis. Unclear                            etiology. Extensive biopsies taken.                           - The examination was otherwise normal. Recommendation:           - Patient has a contact number available for                            emergencies. The signs and symptoms of potential                            delayed complications were discussed with the                            patient. Return to normal activities tomorrow.                            Written discharge instructions were provided to the                            patient.                           - Resume previous diet.                           - Continue present medications.                            - Await pathology results. Rachael Fee, MD 04/07/2019 10:04:38 AM This report has been signed electronically.

## 2019-04-07 NOTE — Progress Notes (Signed)
Temp-JB VS-CW  Interpreter- Fabian November  Pt's states no medical or surgical changes since previsit or office visit.

## 2019-04-07 NOTE — Patient Instructions (Addendum)
The biopsies taken today have been sent for pathology.  The results can take 1-3 weeks to receive.    You may resume your previous diet and medication schedule.  Thank you for allowing us to care for you today!!!  YOU HAD AN ENDOSCOPIC PROCEDURE TODAY AT THE [facility]:   Refer to the procedure report that was given to you for any specific questions about what was found during the examination.  If the procedure report does not answer your questions, please call your gastroenterologist to clarify.  If you requested that your care partner not be given the details of your procedure findings, then the procedure report has been included in a sealed envelope for you to review at your convenience later.  Please Note:  You might notice some irritation and congestion in your nose or some drainage.  This is from the oxygen used during your procedure.  There is no need for concern and it should clear up in a day or so.  SYMPTOMS TO REPORT IMMEDIATELY:   Following upper endoscopy (EGD)  Vomiting of blood or coffee ground material  New chest pain or pain under the shoulder blades  Painful or persistently difficult swallowing  New shortness of breath  Fever of 100F or higher  Black, tarry-looking stools  For urgent or emergent issues, a gastroenterologist can be reached at any hour by calling [phone].   DIET:  We do recommend a small meal at first, but then you may proceed to your regular diet.  Drink plenty of fluids but you should avoid alcoholic beverages for 24 hours.  ACTIVITY:  You should plan to take it easy for the rest of today and you should NOT DRIVE or use heavy machinery until tomorrow (because of the sedation medicines used during the test).    FOLLOW UP: Our staff will call the number listed on your records 48-72 hours following your procedure to check on you and address any questions or concerns that you may have regarding the information given to you following  your procedure. If we do not reach you, we will leave a message.  We will attempt to reach you two times.  During this call, we will ask if you have developed any symptoms of COVID 19. If you develop any symptoms (ie: fever, flu-like symptoms, shortness of breath, cough etc.) before then, please call [phone].  If you test positive for Covid 19 in the 2 weeks post procedure, please call and report this information to us.    If any biopsies were taken you will be contacted by phone or by letter within the next 1-3 weeks.  Please call us at [phone] if you have not heard about the biopsies in 3 weeks.    SIGNATURES/CONFIDENTIALITY: You and/or your care partner have signed paperwork which will be entered into your electronic medical record.  These signatures attest to the fact that that the information above on your After Visit Summary has been reviewed and is understood.  Full responsibility of the confidentiality of this discharge information lies with you and/or your care-partner.   

## 2019-04-07 NOTE — Progress Notes (Signed)
To PACU, VSS. Report to [email or web address] 

## 2019-04-09 ENCOUNTER — Other Ambulatory Visit: Payer: Self-pay

## 2019-04-09 ENCOUNTER — Ambulatory Visit (INDEPENDENT_AMBULATORY_CARE_PROVIDER_SITE_OTHER): Payer: Medicare HMO | Admitting: Internal Medicine

## 2019-04-09 ENCOUNTER — Encounter: Payer: Self-pay | Admitting: Internal Medicine

## 2019-04-09 ENCOUNTER — Telehealth: Payer: Self-pay

## 2019-04-09 DIAGNOSIS — R911 Solitary pulmonary nodule: Secondary | ICD-10-CM | POA: Diagnosis not present

## 2019-04-09 DIAGNOSIS — I10 Essential (primary) hypertension: Secondary | ICD-10-CM

## 2019-04-09 DIAGNOSIS — R0789 Other chest pain: Secondary | ICD-10-CM | POA: Diagnosis not present

## 2019-04-09 MED ORDER — LISINOPRIL-HYDROCHLOROTHIAZIDE 20-12.5 MG PO TABS: 1.0000 | ORAL_TABLET | Freq: Every day | ORAL | Status: DC

## 2019-04-09 NOTE — Progress Notes (Signed)
Marco Payne, male    DOB: [birth year],    MRN: [id number]   Brief patient profile:  69 yo latino male  from [place]  quit smoking in 1981 no sequelae with new onset lower chest/ upper abd pain around 2015 w/u in [place] ? GB dz > no change p removed with no change in symptoms but told he had a spot on his lung that required follow up and does not recall any specifics of the w/u so referred to pulmonary clinic 04/09/2019 by Dr   Christella Hartigan, says PPI not helping chest/ upper abd daily discomfort.     History of Present Illness  04/09/2019  Pulmonary/ 1st office eval/Latravis Grine  Chief Complaint  Patient presents with  . Pulmonary Consult    Referred by Dr. Christella Hartigan for eval of abnormal CT abd. Pt denies any respiratory c/o's.    Dyspnea:  When abd swells up makes him feel sob/lower chest discomfort x 5 years ?   gb surgery [place] > no better - he says the original pain that resulted in w/u did not change p lap chole. Cough: new cough x 9 months on ACEi / daytime, dry, tickle in throat Sleep: Says chest/ abd symptoms always go  away supine and sleeps fine flat SABA use: none   No obvious day to day or daytime variability or assoc excess/ purulent sputum or mucus plugs or hemoptysis or cp or chest tightness, subjective wheeze or overt sinus or hb symptoms.   Sleeping fine flat  without nocturnal  or early am exacerbation  of respiratory  c/o's or need for noct saba. Also denies any obvious fluctuation of symptoms with weather or environmental changes or other aggravating or alleviating factors except as outlined above   No unusual exposure hx or h/o childhood pna/ asthma or knowledge of premature birth.  Current Allergies, Complete Past Medical History, Past Surgical History, Family History, and Social History were reviewed in [employer] record.  ROS  The following are not active complaints unless bolded Hoarseness, sore throat, dysphagia, dental problems, itching, sneezing,   nasal congestion or discharge of excess mucus or purulent secretions, ear ache,   fever, chills, sweats, unintended wt loss or wt gain, classically pleuritic or exertional cp,  orthopnea pnd or arm/hand swelling  or leg swelling, presyncope, palpitations, abdominal pain, anorexia, nausea, vomiting, diarrhea  or change in bowel habits = constipation or change in bladder habits, change in stools or change in urine, dysuria, hematuria,  rash, arthralgias, visual complaints, headache, numbness, weakness or ataxia or problems with walking or coordination,  change in mood or  memory.             Past Medical History:  Diagnosis Date  . Diabetes mellitus without complication (HCC)    type 1   . GERD (gastroesophageal reflux disease)   . Hyperlipidemia   . Hypertension   . Pancreatitis     Outpatient Medications Prior to Visit  Medication Sig Dispense Refill  . amLODipine (NORVASC) 10 MG tablet Take 10 mg by mouth daily.    Marland Kitchen atorvastatin (LIPITOR) 20 MG tablet Take 20 mg by mouth daily.    . B-D UF III MINI PEN NEEDLES 31G X 5 MM MISC     . gabapentin (NEURONTIN) 600 MG tablet Take 600 mg by mouth 3 (three) times daily.    . insulin aspart (NOVOLOG FLEXPEN) 100 UNIT/ML FlexPen Inject 20 Units into the skin 3 (three) times daily with meals. And pen  needles 4/day 10 pen 11  . Insulin Glargine (LANTUS SOLOSTAR) 100 UNIT/ML Solostar Pen Inject 25 Units into the skin daily. 5 pen PRN  . lisinopril-hydrochlorothiazide (ZESTORETIC) 20-12.5 MG tablet Take 1 tablet by mouth daily.    Marland Kitchen omeprazole (PRILOSEC) 40 MG capsule Take 40 mg by mouth daily.    . pantoprazole (PROTONIX) 40 MG tablet Take 1 tablet (40 mg total) by mouth daily. (Patient not taking: Reported on 04/07/2019) 30 tablet 11      Objective:     BP 138/70 (BP Location: Left Arm, Cuff Size: Normal)   Pulse 78   Temp 97.8 F (36.6 C) (Temporal)   Ht 5\' 8"  (1.727 m)   Wt 210 lb (95.3 kg)   SpO2 98% Comment: on RA  BMI 31.93 kg/m    SpO2: 98 %(on RA)    HEENT : pt wearing mask not removed for exam due to covid -19 concerns.    NECK :  without JVD/Nodes/TM/ nl carotid upstrokes bilaterally   LUNGS: no acc muscle use,  Nl contour chest which is clear to A and P bilaterally without cough on insp or exp maneuvers   CV:  RRR  no s3 or murmur or increase in P2, and no edema   ABD:  Obese/ mod distended and tympanitic  and nontender with nl inspiratory excursion in the supine position. No bruits or organomegaly appreciated, bowel sounds nl  MS:  Nl gait/ ext warm without deformities, calf tenderness, cyanosis or clubbing No obvious joint restrictions   SKIN: warm and dry without lesions    NEURO:  alert, approp, nl sensorium with  no motor or cerebellar deficits apparent.     I personally reviewed images and agree with radiology impression as follows:   Chest CT cuts on abd ct 01/22/2019 Ovoid area of intermediate density, approximately 30 Hounsfield units in the left infrahilar region measures it has 2.5 x 1.8 cm. Basilar atelectasis. No signs of pleural effusion. No signs of pericardial effusion.    Assessment   Solitary pulmonary nodule on lung CT Quit smoking 1981 - 1st detected [place] ? 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  Even thru interpreter I had a very difficult time getting thru the details of his w/u to date and strongly rec he see Dr Patsey Berthold to work out better details and decision making re if/when intervention is needed here.   The cough is likely due to ACEi and should consider trial off  (see hbp)    Chest discomfort He  has a sensation of "chest discomfort" that is classic for IBS in that it is a fullness in the upper abd/ lower chest always resolves in supine position but present daily once upright assoc with constipation on high doses of CCB  Rec: IBS diet/ citrucel/ change CCB to Betablocker per PCP (see hbp)     Essential hypertension On ACEi and high dose norvasc.  Due to constipation / IBS rec off norvasc and on bisoprolol  Due to daytime tickle/ cough rec trial off acei and on ARB  >>> defer changes to PCP  as will need to be monitored with multiple changes in rx and neither issue is of immediate concern.      Total time devoted to counseling  > 50 % of initial 60 min office visit:  review case with pt via interpreter discussion of options/alternatives/ personally creating written customized instructions  in presence of pt  then going over those specific  Instructions directly with the pt including how to use all of the meds but in particular covering each new medication in detail and the difference between the maintenance= "automatic" meds and the prns using an action plan format for the latter (If this problem/symptom => do that organization reading Left to right).  Please see AVS from this visit for a full list of these instructions which I personally wrote for this pt and provided in spanish via google and  are unique to this visit.      Sandrea Hughs, MD 04/09/2019

## 2019-04-09 NOTE — Telephone Encounter (Signed)
  Follow up Call-  Call back number 04/07/2019  Post procedure Call Back phone  # [phone]  Permission to leave phone message Yes     Patient questions:  Do you have a fever, pain , or abdominal swelling? No. Pain Score  0 *  Have you tolerated food without any problems? Yes.    Have you been able to return to your normal activities? Yes.    Do you have any questions about your discharge instructions: Diet   No. Medications  No. Follow up visit  No.  Do you have questions or concerns about your Care? No.  Actions: * If pain score is 4 or above: No action needed, pain <4.  Patient spoke minimal english but answered the questions.   1.  Have you developed a fever since your procedure? no  2.   Have you had an respiratory symptoms (SOB or cough) since your procedure? no  3.   Have you tested positive for COVID 19 since your procedure no  4.   Have you had any family members/close contacts diagnosed with the COVID 19 since your procedure?  no   If yes to any of these questions please route to Laverna Peace, RN and Jennye Boroughs, Charity fundraiser.

## 2019-04-09 NOTE — Patient Instructions (Addendum)
Amlopine at 10 mg per day is likely contributing to constipation > I recommend it be changed to bisoprolol 5 mg daily   Zestoretic is likely contributing to your cough  > I  Recommend it be changed benicar 20-12.5 one daily   Both changes will need to be monitored carefully by you and your PCP.  Classic subdiaphragmatic pain pattern suggests ibs:   Occurs daytime, not usually exacerbated by exercise  or coughing, worse in sitting position, frequently associated with generalized abd bloating, not as likely to be present supine due to the dome effect of the diaphragm which  is  canceled in that position. Frequently these patients have had multiple negative GI workups and CT scans.  Treatment consists of avoiding foods that cause gas (especially boiled eggs, mexcican food but especially  beans and undercooked vegetables like  spinach and some salads)  and citrucel 1 heaping tsp twice daily with a large glass of water.  Pain should improve w/in 2 weeks .   We will set you up to see Dr Jayme Cloud in 4 weeks in [facility] but you must bring all your paperwork with you from [facility] or provide the fax numbers to the offices where you were seen so we can obtain your records regarding workup for the spot on your lung.  I strongly recommend you take all your medications to see all your doctors     Es probable que la amlopina a 10 mg por da contribuya al estreimiento> Recomiendo que se cambie a bisoprolol 5 mg al da  Es probable que Zestoretic contribuya a su tos> Recomiendo que se cambie Benicar 20-12.5 uno al da  Ambos cambios debern ser monitoreados cuidadosamente por usted y su PCP.  El patrn clsico de dolor subdiafragmtico sugiere ibs: ocurre Administrator, generalmente no se exacerba con el ejercicio o la tos, Teacher, music en posicin sentada, frecuentemente asociado con distensin abdominal generalizada, no es tan probable que est presente en decbito supino debido al [employer] de cpula del  diafragma que se cancela en ese posicin. Con frecuencia, estos pacientes han tenido mltiples exploraciones gastrointestinales y tomografas computarizadas negativas.  El Forensic psychologist en evitar los alimentos que causan gases (especialmente huevos duros, comida mexicana pero especialmente frijoles y verduras poco cocidas como espinacas y Merchant navy officer) y citrucel 1 cucharadita colmada dos veces al da con un gran vaso de agua. El dolor debera mejorar en 2 semanas.   Lo programaremos para ver al Dr. Lelon Mast en 4 semanas en la [place], pero debe traer todos sus documentos de la ciudad de [place] o proporcionar los nmeros de fax a las oficinas donde lo atendieron para que podamos obtener sus registros sobre el examen para Immunologist en tu pulmn.  Te recomiendo encarecidamente que tomes todos tus medicamentos para ver a todos tus mdicos.

## 2019-04-10 ENCOUNTER — Institutional Professional Consult (permissible substitution): Payer: Medicare HMO | Admitting: Internal Medicine

## 2019-04-10 ENCOUNTER — Encounter: Payer: Self-pay | Admitting: Internal Medicine

## 2019-04-10 DIAGNOSIS — R911 Solitary pulmonary nodule: Secondary | ICD-10-CM | POA: Insufficient documentation

## 2019-04-10 NOTE — Assessment & Plan Note (Addendum)
Quit smoking 1981 - 1st detected [place] ? 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 before embarking on additional studies in the middle of a pandemic.  Even thru interpreter I had a very difficult time getting thru the details of his w/u to date and strongly rec he see Dr Jayme Cloud to work out better details and decision making re if/when intervention is needed here.   The cough is likely due to ACEi and should consider trial off  (see hbp)

## 2019-04-10 NOTE — Assessment & Plan Note (Signed)
On ACEi and high dose norvasc.  Due to constipation / IBS rec off norvasc and on bisoprolol  Due to daytime tickle/ cough rec trial off acei and on ARB  >>> defer changes to PCP as will need to be monitored with multiple changes in rx and neither issue is of immediate concern.   Total time devoted to counseling  > 50 % of initial 60 min office visit:  review case with pt via interpreter discussion of options/alternatives/ personally creating written customized instructions  in presence of pt  then going over those specific  Instructions directly with the pt including how to use all of the meds but in particular covering each new medication in detail and the difference between the maintenance= "automatic" meds and the prns using an action plan format for the latter (If this problem/symptom => do that organization reading Left to right).  Please see AVS from this visit for a full list of these instructions which I personally wrote for this pt and provided in spanish via google and  are unique to this visit.

## 2019-04-10 NOTE — Assessment & Plan Note (Signed)
He has a sensation of "chest discomfort" that is classic for IBS in that it is a fullness in the upper abd/ lower chest always resolves in supine position but present daily once upright assoc with constipation on high doses of CCB  Rec: IBS diet/ citrucel/ change CCB to Betablocker per PCP (see hbp)

## 2019-04-15 ENCOUNTER — Other Ambulatory Visit: Payer: Self-pay

## 2019-04-15 DIAGNOSIS — E1040 Type 1 diabetes mellitus with diabetic neuropathy, unspecified: Secondary | ICD-10-CM

## 2019-04-15 MED ORDER — LANTUS SOLOSTAR 100 UNIT/ML ~~LOC~~ SOPN: 25.0000 [IU] | PEN_INJECTOR | Freq: Every day | SUBCUTANEOUS | 0 refills | Status: DC

## 2019-04-16 ENCOUNTER — Other Ambulatory Visit: Payer: Self-pay

## 2019-04-16 MED ORDER — OMEPRAZOLE 40 MG PO CPDR: 40.0000 mg | DELAYED_RELEASE_CAPSULE | Freq: Two times a day (BID) | ORAL | 11 refills | Status: DC

## 2019-04-25 ENCOUNTER — Encounter: Payer: Self-pay | Admitting: Dietician

## 2019-04-25 ENCOUNTER — Ambulatory Visit (INDEPENDENT_AMBULATORY_CARE_PROVIDER_SITE_OTHER): Payer: Medicare HMO | Admitting: Endocrinology

## 2019-04-25 ENCOUNTER — Encounter: Payer: Medicare HMO | Attending: Endocrinology | Admitting: Dietician

## 2019-04-25 ENCOUNTER — Encounter: Payer: Self-pay | Admitting: Endocrinology

## 2019-04-25 ENCOUNTER — Other Ambulatory Visit: Payer: Self-pay

## 2019-04-25 VITALS — BP 138/80 | HR 97 | Ht 70.0 in | Wt 213.6 lb

## 2019-04-25 DIAGNOSIS — E1040 Type 1 diabetes mellitus with diabetic neuropathy, unspecified: Secondary | ICD-10-CM | POA: Insufficient documentation

## 2019-04-25 LAB — POCT GLYCOSYLATED HEMOGLOBIN (HGB A1C): Hemoglobin A1C: 8.1 % — AB (ref 4.0–5.6)

## 2019-04-25 MED ORDER — LANTUS SOLOSTAR 100 UNIT/ML ~~LOC~~ SOPN: 25.0000 [IU] | PEN_INJECTOR | Freq: Every day | SUBCUTANEOUS | 11 refills | Status: DC

## 2019-04-25 MED ORDER — NOVOLOG FLEXPEN 100 UNIT/ML ~~LOC~~ SOPN: 25.0000 [IU] | PEN_INJECTOR | Freq: Three times a day (TID) | SUBCUTANEOUS | 11 refills | Status: DC

## 2019-04-25 NOTE — Patient Instructions (Addendum)
check your blood sugar twice a day.  vary the time of day when you check, between before the 3 meals, and at bedtime.  also check if you have symptoms of your blood sugar being too high or too low.  please keep a record of the readings and bring it to your next appointment here (or you can bring the meter itself).  You can write it on any piece of paper.  please call us sooner if your blood sugar goes below 70, or if you have a lot of readings over 200. Please increase the novolog to 25 units 3 times a day (just before each meal), and: Please continue the same Lantus.     Please come back for a follow-up appointment in 2 months.   controle su azcar en [employer] al da. Vare la hora del da en que lo comprueba, entre antes de las 3 comidas y antes de [place]. Compruebe tambin si tiene sntomas de que su nivel de International aid/development worker en sangre es demasiado alto o demasiado bajo. por favor mantenga un registro de las lecturas y Nurse, adult a su prxima cita aqu (o puede traer Chief Executive Officer). Puedes escribirlo en cualquier hoja de papel. Llmenos antes si su nivel de azcar en sangre es inferior a 70 o si tiene muchas lecturas superiores a 200. Aumente el novolog a 25 unidades 3 veces al da (justo antes de cada comida) y: Contine con el mismo Lantus. Regrese para una cita de seguimiento en 2 meses.
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
## 2019-05-07 ENCOUNTER — Ambulatory Visit (INDEPENDENT_AMBULATORY_CARE_PROVIDER_SITE_OTHER): Payer: Medicare HMO | Admitting: Pulmonary Disease

## 2019-05-07 ENCOUNTER — Other Ambulatory Visit: Payer: Self-pay

## 2019-05-07 ENCOUNTER — Encounter: Payer: Self-pay | Admitting: Pulmonary Disease

## 2019-05-07 VITALS — BP 148/72 | HR 88 | Temp 97.8°F | Wt 214.4 lb

## 2019-05-07 DIAGNOSIS — R0602 Shortness of breath: Secondary | ICD-10-CM

## 2019-05-07 DIAGNOSIS — R911 Solitary pulmonary nodule: Secondary | ICD-10-CM
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
## 2019-05-07 NOTE — Patient Instructions (Addendum)
Le vamos a obtener un CT del torax  Lo volvemos a ver despues de que se [facility] CT, dentro de 2-3 semanas  (8 Marzo 2021 @ 11:00 AM)  Va a firmar una forma para obtener los records de Psychiatrist en [place]

## 2019-05-22 ENCOUNTER — Other Ambulatory Visit: Payer: Self-pay

## 2019-05-22 ENCOUNTER — Ambulatory Visit (HOSPITAL_COMMUNITY)
Admission: RE | Admit: 2019-05-22 | Discharge: 2019-05-22 | Disposition: A | Discharge status: Home / Self Care | Payer: Medicare HMO | Source: Ambulatory Visit | Attending: Pulmonary Disease | Admitting: Pulmonary Disease

## 2019-05-22 DIAGNOSIS — R0602 Shortness of breath: Secondary | ICD-10-CM | POA: Insufficient documentation

## 2019-05-22 DIAGNOSIS — R911 Solitary pulmonary nodule: Secondary | ICD-10-CM | POA: Diagnosis not present

## 2019-05-26 ENCOUNTER — Other Ambulatory Visit: Payer: Self-pay | Admitting: Pulmonary Disease

## 2019-05-26 ENCOUNTER — Ambulatory Visit: Payer: Medicare HMO | Admitting: Pulmonary Disease

## 2019-05-26 DIAGNOSIS — R911 Solitary pulmonary nodule: Secondary | ICD-10-CM

## 2019-06-01 ENCOUNTER — Emergency Department (HOSPITAL_COMMUNITY): Payer: Medicare HMO

## 2019-06-01 ENCOUNTER — Other Ambulatory Visit: Payer: Self-pay

## 2019-06-01 ENCOUNTER — Emergency Department (HOSPITAL_COMMUNITY)
Admission: EM | Admit: 2019-06-01 | Discharge: 2019-06-01 | Disposition: A | Discharge status: Home / Self Care | Payer: Medicare HMO | Attending: Emergency Medicine | Admitting: Emergency Medicine

## 2019-06-01 DIAGNOSIS — I712 Thoracic aortic aneurysm, without rupture: Secondary | ICD-10-CM | POA: Insufficient documentation

## 2019-06-01 DIAGNOSIS — R0789 Other chest pain: Secondary | ICD-10-CM | POA: Diagnosis not present

## 2019-06-01 DIAGNOSIS — Z20822 Contact with and (suspected) exposure to covid-19: Secondary | ICD-10-CM | POA: Insufficient documentation

## 2019-06-01 DIAGNOSIS — Z79899 Other long term (current) drug therapy: Secondary | ICD-10-CM | POA: Insufficient documentation

## 2019-06-01 DIAGNOSIS — E109 Type 1 diabetes mellitus without complications: Secondary | ICD-10-CM | POA: Insufficient documentation

## 2019-06-01 DIAGNOSIS — Z87891 Personal history of nicotine dependence: Secondary | ICD-10-CM | POA: Diagnosis not present

## 2019-06-01 DIAGNOSIS — R0602 Shortness of breath: Secondary | ICD-10-CM | POA: Diagnosis not present

## 2019-06-01 DIAGNOSIS — I10 Essential (primary) hypertension: Secondary | ICD-10-CM | POA: Diagnosis not present

## 2019-06-01 DIAGNOSIS — R079 Chest pain, unspecified: Secondary | ICD-10-CM | POA: Diagnosis not present

## 2019-06-01 DIAGNOSIS — I7123 Aneurysm of the descending thoracic aorta, without rupture: Secondary | ICD-10-CM

## 2019-06-01 DIAGNOSIS — R911 Solitary pulmonary nodule: Secondary | ICD-10-CM | POA: Insufficient documentation

## 2019-06-01 DIAGNOSIS — R10812 Left upper quadrant abdominal tenderness: Secondary | ICD-10-CM | POA: Insufficient documentation

## 2019-06-01 DIAGNOSIS — R197 Diarrhea, unspecified: Secondary | ICD-10-CM | POA: Insufficient documentation

## 2019-06-01 LAB — CBC WITH DIFFERENTIAL/PLATELET
Abs Immature Granulocytes: 0.03 10*3/uL (ref 0.00–0.07)
Basophils Absolute: 0 10*3/uL (ref 0.0–0.1)
Basophils Relative: 1 %
Eosinophils Absolute: 0.1 10*3/uL (ref 0.0–0.5)
Eosinophils Relative: 1 %
HCT: 51.6 % (ref 39.0–52.0)
Hemoglobin: 17 g/dL (ref 13.0–17.0)
Immature Granulocytes: 0 %
Lymphocytes Relative: 6 %
Lymphs Abs: 0.5 10*3/uL — ABNORMAL LOW (ref 0.7–4.0)
MCH: 27.9 pg (ref 26.0–34.0)
MCHC: 32.9 g/dL (ref 30.0–36.0)
MCV: 84.6 fL (ref 80.0–100.0)
Monocytes Absolute: 0.6 10*3/uL (ref 0.1–1.0)
Monocytes Relative: 6 %
Neutro Abs: 7.6 10*3/uL (ref 1.7–7.7)
Neutrophils Relative %: 86 %
Platelets: 192 10*3/uL (ref 150–400)
RBC: 6.1 MIL/uL — ABNORMAL HIGH (ref 4.22–5.81)
RDW: 13.8 % (ref 11.5–15.5)
WBC: 8.9 10*3/uL (ref 4.0–10.5)
nRBC: 0 % (ref 0.0–0.2)

## 2019-06-01 LAB — URINALYSIS, ROUTINE W REFLEX MICROSCOPIC
Bacteria, UA: NONE SEEN
Bilirubin Urine: NEGATIVE
Glucose, UA: NEGATIVE mg/dL
Ketones, ur: NEGATIVE mg/dL
Leukocytes,Ua: NEGATIVE
Nitrite: NEGATIVE
Protein, ur: 100 mg/dL — AB
Specific Gravity, Urine: 1.029 (ref 1.005–1.030)
pH: 5 (ref 5.0–8.0)

## 2019-06-01 LAB — COMPREHENSIVE METABOLIC PANEL
ALT: 40 U/L (ref 0–44)
AST: 44 U/L — ABNORMAL HIGH (ref 15–41)
Albumin: 4 g/dL (ref 3.5–5.0)
Alkaline Phosphatase: 85 U/L (ref 38–126)
Anion gap: 12 (ref 5–15)
BUN: 24 mg/dL — ABNORMAL HIGH (ref 8–23)
CO2: 22 mmol/L (ref 22–32)
Calcium: 9.1 mg/dL (ref 8.9–10.3)
Chloride: 105 mmol/L (ref 98–111)
Creatinine, Ser: 1.35 mg/dL — ABNORMAL HIGH (ref 0.61–1.24)
GFR calc Af Amer: >60 mL/min (ref >60)
GFR calc non Af Amer: 54 mL/min — ABNORMAL LOW (ref >60)
Glucose, Bld: 131 mg/dL — ABNORMAL HIGH (ref 70–99)
Potassium: 4.4 mmol/L (ref 3.5–5.1)
Sodium: 139 mmol/L (ref 135–145)
Total Bilirubin: 1.7 mg/dL — ABNORMAL HIGH (ref 0.3–1.2)
Total Protein: 7.5 g/dL (ref 6.5–8.1)

## 2019-06-01 LAB — POC SARS CORONAVIRUS 2 AG -  ED: SARS Coronavirus 2 Ag: NEGATIVE

## 2019-06-01 LAB — TROPONIN I (HIGH SENSITIVITY)
Troponin I (High Sensitivity): 7 ng/L (ref <18)
Troponin I (High Sensitivity): 7 ng/L (ref <18)

## 2019-06-01 LAB — D-DIMER, QUANTITATIVE: D-Dimer, Quant: 0.6 ug/mL-FEU — ABNORMAL HIGH (ref 0.00–0.50)

## 2019-06-01 LAB — CBG MONITORING, ED: Glucose-Capillary: 114 mg/dL — ABNORMAL HIGH (ref 70–99)

## 2019-06-01 LAB — LIPASE, BLOOD: Lipase: 24 U/L (ref 11–51)

## 2019-06-01 MED ORDER — IOHEXOL 350 MG/ML SOLN
80.0000 mL | Freq: Once | INTRAVENOUS | Status: AC | PRN
  Administered 2019-06-01: 66 mL via INTRAVENOUS

## 2019-06-01 MED ORDER — PANTOPRAZOLE SODIUM 40 MG PO TBEC: 40.0000 mg | DELAYED_RELEASE_TABLET | Freq: Every day | ORAL | 0 refills | Status: DC

## 2019-06-01 MED ORDER — ASPIRIN 81 MG PO CHEW
324.0000 mg | CHEWABLE_TABLET | Freq: Once | ORAL | Status: AC
  Administered 2019-06-01: 324 mg via ORAL
  Filled 2019-06-01: qty 4

## 2019-06-01 MED ORDER — SODIUM CHLORIDE 0.9 % IV BOLUS
500.0000 mL | Freq: Once | INTRAVENOUS | Status: AC
  Administered 2019-06-01: 500 mL via INTRAVENOUS

## 2019-06-01 NOTE — ED Notes (Signed)
Patient verbalizes understanding of discharge instructions, prescription medications, and follow up care. Opportunity for questioning and answers were provided. All questions answered completely. Armband removed by staff, pt discharged from ED. Ambulatory with strong, steady gait 

## 2019-06-01 NOTE — Discharge Instructions (Signed)
Your labwork was reassuring today. Your CT scan did show an enlargement of the thoracic aorta that will need to be monitored. Please follow up with your PCP about this.  We have swabbed you for COVID 19 today - please stay home and self isolate until you receive the results. Please take Tylenol as needed for pain.  Return to the ED for any worsening symptoms including worsening chest pain, worsening shortness of breath, or any other concerning symptoms.

## 2019-06-01 NOTE — ED Notes (Signed)
Assumed care of pt. Pt alert, resting on cart in NAD. Breathing easy, non-labored. VSS on monitors. Call light within reach. Denies any needs at this time. Will continue to monitor

## 2019-06-01 NOTE — ED Provider Notes (Signed)
[facility] EMERGENCY DEPARTMENT Provider Note   CSN: [id number] Arrival date & time: 06/01/19  1504     History No chief complaint on file.   Marco Payne is a 69 y.o. male with PMHx type 1 Diabetes, GERD, HTN, HLD, and pancreatitis who presents to the ED today complaining of gradual onset, constant, diffuse chest pain that began yesterday. Pt also complains of SOB and watery diarrhea. Pt reports hx of chest pain in the past however states that this feels worse. He mentions he has been out of his blood pressure medication and his protonix for the past 2 months. He states he had an endoscopy done in January after complaining of chest pains but no one has been able to figure out what his pain is coming from. He denies any recent sick contacts however reports with COVID 19 pandemic "you never know who you are around." Pt denies alcohol use - he states that he saw an endocrinologist who doubted his pancreatitis diagnosis. Denies fevers, chills, cough, hemoptysis, nausea, vomiting, abdominal pain, or any other associated symptoms. NO hx DVT/PE. No recent prolonged travel or immobilization. No active malignancy.   The history is provided by the patient and medical records. The history is limited by a language barrier. A language interpreter was used.       Past Medical History:  Diagnosis Date  . Diabetes mellitus without complication ([place])    type 1   . GERD (gastroesophageal reflux disease)   . Hyperlipidemia   . Hypertension   . Pancreatitis     Patient Active Problem List   Diagnosis Date Noted  . Solitary pulmonary nodule on lung CT 04/10/2019  . Chest discomfort 02/03/2019  . Mixed dyslipidemia 02/03/2019  . Chest pain 01/30/2019  . Elevated LDL cholesterol level 12/09/2018  . Gastroesophageal reflux disease 12/06/2018  . Encounter for health maintenance examination with abnormal findings 12/06/2018  . Presbycusis 12/06/2018  . Type 1 diabetes mellitus with  diabetic neuropathy, unspecified ([place]) 12/06/2018  . Essential hypertension 12/06/2018    Past Surgical History:  Procedure Laterality Date  . CHOLECYSTECTOMY    . COLONOSCOPY     pt states 2016  . UPPER GASTROINTESTINAL ENDOSCOPY     in [place]        Family History  Problem Relation Age of Onset  . Diabetes Mother   . Heart attack Brother   . Colon cancer Neg Hx   . Esophageal cancer Neg Hx   . Rectal cancer Neg Hx   . Stomach cancer Neg Hx     Social History   Tobacco Use  . Smoking status: Former Smoker    Packs/day: 0.25    Years: 9.00    Pack years: 2.25    Types: Cigarettes    Quit date: 1981    Years since quitting: 40.2  . Smokeless tobacco: Never Used  Substance Use Topics  . Alcohol use: Not Currently  . Drug use: Never    Home Medications Prior to Admission medications   Medication Sig Start Date End Date Taking? Authorizing Provider  amLODipine (NORVASC) 10 MG tablet Take 10 mg by mouth daily.    [provider]  atorvastatin (LIPITOR) 20 MG tablet Take 20 mg by mouth daily.    [provider]  gabapentin (NEURONTIN) 600 MG tablet Take 600 mg by mouth 3 (three) times daily.    [provider]  insulin aspart (NOVOLOG FLEXPEN) 100 UNIT/ML FlexPen Inject 25 Units into the skin  3 (three) times daily with meals. And pen needles 4/day 04/25/19   Romero BellingEllison, Sean, MD  Insulin Glargine (LANTUS SOLOSTAR) 100 UNIT/ML Solostar Pen Inject 25 Units into the skin daily. 04/25/19   Romero BellingEllison, Sean, MD  lisinopril-hydrochlorothiazide (ZESTORETIC) 20-12.5 MG tablet Take 1 tablet by mouth daily. 04/09/19   Nyoka CowdenWert, Michael B, MD  omeprazole (PRILOSEC) 40 MG capsule Take 1 capsule (40 mg total) by mouth 2 (two) times daily. 04/16/19   Rachael FeeJacobs, Daniel P, MD  pantoprazole (PROTONIX) 40 MG tablet Take 1 tablet (40 mg total) by mouth daily. 06/01/19   Tanda RockersVenter, Blain Hunsucker, PA-C    Allergies    Patient has no known allergies.  Review of Systems   Review of Systems    Constitutional: Negative for chills and fever.  Respiratory: Positive for shortness of breath.   Cardiovascular: Positive for chest pain.  Gastrointestinal: Positive for diarrhea. Negative for abdominal pain, nausea and vomiting.  All other systems reviewed and are negative.   Physical Exam Updated Vital Signs BP 127/87 (BP Location: Right Arm)   Pulse (!) 102   Temp 98.6 F (37 C) (Oral)   Resp 16   SpO2 95%   Physical Exam Vitals and nursing note reviewed.  Constitutional:      General: He is not in acute distress.    Appearance: He is not ill-appearing or diaphoretic.  HENT:     Head: Normocephalic and atraumatic.  Eyes:     Conjunctiva/sclera: Conjunctivae normal.  Cardiovascular:     Rate and Rhythm: Normal rate and regular rhythm.  Pulmonary:     Effort: Pulmonary effort is normal.     Breath sounds: Normal breath sounds. No wheezing or rales.  Chest:     Chest wall: No tenderness.  Abdominal:     Palpations: Abdomen is soft.     Tenderness: There is abdominal tenderness. There is no right CVA tenderness, left CVA tenderness, guarding or rebound.     Comments: Soft, + LUQ TTP, +BS throughout, no r/g/r, neg murphy's, neg mcburney's, no CVA TTP  Musculoskeletal:     Cervical back: Neck supple.     Right lower leg: No edema.     Left lower leg: No edema.  Skin:    General: Skin is warm and dry.  Neurological:     Mental Status: He is alert.     ED Results / Procedures / Treatments   Labs (all labs ordered are listed, but only abnormal results are displayed) Labs Reviewed  URINALYSIS, ROUTINE W REFLEX MICROSCOPIC - Abnormal; Notable for the following components:      Result Value   Hgb urine dipstick SMALL (*)    Protein, ur 100 (*)    All other components within normal limits  COMPREHENSIVE METABOLIC PANEL - Abnormal; Notable for the following components:   Glucose, Bld 131 (*)    BUN 24 (*)    Creatinine, Ser 1.35 (*)    AST 44 (*)    Total Bilirubin  1.7 (*)    GFR calc non Af Amer 54 (*)    All other components within normal limits  CBC WITH DIFFERENTIAL/PLATELET - Abnormal; Notable for the following components:   RBC 6.10 (*)    Lymphs Abs 0.5 (*)    All other components within normal limits  D-DIMER, QUANTITATIVE (NOT AT [facility]) - Abnormal; Notable for the following components:   D-Dimer, Quant 0.60 (*)    All other components within normal limits  CBG MONITORING, ED - Abnormal;  Notable for the following components:   Glucose-Capillary 114 (*)    All other components within normal limits  SARS CORONAVIRUS 2 (TAT 6-24 HRS)  LIPASE, BLOOD  POC SARS CORONAVIRUS 2 AG -  ED  TROPONIN I (HIGH SENSITIVITY)  TROPONIN I (HIGH SENSITIVITY)    EKG EKG Interpretation  Date/Time:  Sunday June 01 2019 16:05:55 EDT Ventricular Rate:  99 PR Interval:    QRS Duration: 94 QT Interval:  347 QTC Calculation: 446 R Axis:   -82 Text Interpretation: Sinus rhythm LAD, consider left anterior fascicular block No previous tracing Confirmed by Gwyneth Sprout ([id number]) on 06/01/2019 4:17:52 PM   Radiology CT Angio Chest PE W/Cm &/Or Wo Cm  Result Date: 06/01/2019 CLINICAL DATA:  Encounter, shortness of breath, hematemesis EXAM: CT ANGIOGRAPHY CHEST WITH CONTRAST TECHNIQUE: Multidetector CT imaging of the chest was performed using the standard protocol during bolus administration of intravenous contrast. Multiplanar CT image reconstructions and MIPs were obtained to evaluate the vascular anatomy. CONTRAST:  46mL OMNIPAQUE IOHEXOL 350 MG/ML SOLN COMPARISON:  05/22/2019 FINDINGS: Cardiovascular: Satisfactory opacification of the pulmonary arteries to the segmental level. No evidence of pulmonary embolism. Cardiomegaly. Enlargement of the main pulmonary artery up to 3.5 cm. No pericardial effusion. Calcification of the aortic valve. Severe, irregular aortic atherosclerosis of the descending thoracic aorta. Enlargement of the proximal descending thoracic  aorta measuring up to 3.9 x 3.7 cm. Mediastinum/Nodes: No enlarged mediastinal, hilar, or axillary lymph nodes. Thyroid gland, trachea, and esophagus demonstrate no significant findings. Lungs/Pleura: Unchanged infrahilar left lung nodule measuring 2.7 x 2.1 cm (series 7, image 193). Mosaic attenuation of the airspaces. Clustered, irregular airspace cysts of the upper lobes. No pleural effusion or pneumothorax. Upper Abdomen: No acute abnormality. Musculoskeletal: No chest wall abnormality. No acute or significant osseous findings. Review of the MIP images confirms the above findings. IMPRESSION: 1. Negative examination for pulmonary embolism. 2. Mosaic attenuation of the airspaces, suggestive of small airways disease. 3. Unchanged 2.7 cm nodule in the infrahilar left lung, concerning for malignancy. 4. Cardiomegaly. 5. Enlargement of the main pulmonary artery up to 3.5 cm, which can be seen with pulmonary hypertension. 6. Severe, irregular aortic atherosclerosis of the descending thoracic aorta. Enlargement of the proximal descending thoracic aorta measuring up to 3.9 cm. Aortic Atherosclerosis (ICD10-I70.0). 7. Aortic valve calcifications. Electronically Signed   By: Lauralyn Primes M.D.   On: 06/01/2019 17:17   DG Chest Portable 1 View  Result Date: 06/01/2019 CLINICAL DATA:  Chest pain. EXAM: PORTABLE CHEST 1 VIEW COMPARISON:  None. FINDINGS: The heart size and mediastinal contours are within normal limits. Both lungs are clear. Mild multilevel degenerative changes seen throughout the thoracic spine. IMPRESSION: No active disease. Electronically Signed   By: Aram Candela M.D.   On: 06/01/2019 16:04    Procedures Procedures (including critical care time)  Medications Ordered in ED Medications  aspirin chewable tablet 324 mg (324 mg Oral Given 06/01/19 1645)  iohexol (OMNIPAQUE) 350 MG/ML injection 80 mL (66 mLs Intravenous Contrast Given 06/01/19 1700)  sodium chloride 0.9 % bolus 500 mL (500 mLs  Intravenous New Bag/Given 06/01/19 1905)    ED Course  I have reviewed the triage vital signs and the nursing notes.  Pertinent labs & imaging results that were available during my care of the patient were reviewed by me and considered in my medical decision making (see chart for details).  69 year old Spanish-speaking male who presents to the ED today complaining of diffuse upper chest pain that started  in the left side and goes across.  He also planes of shortness of breath.  On arrival to the ED patient is afebrile, tachycardic in the low 100s, tachypneic.  He appears to be in no acute distress.  Patient is pointing to his left ribs/left upper quadrant when discussing pain origin.  He has history of pancreatitis however denies alcohol use.  No recent COVID-19 positive exposure.  Somewhat difficult to obtain history even with Spanish interpreter.  Will obtain screening labs at this time including abdominal labs given history of pancreatitis.  Will work-up for ACS however story does not seem very convincing for ACS.  Will obtain D-dimer given tachycardia and unable to [facility] out due to age.  Will swab for Covid. Fluids given for tachycardia.   CBC without leukocytosis. Hgb stable.  CMP with creatinine 1.35 however this appears to be pt's baseline. Glucose 131.Bicarb 22. BUN 24. No anion gap.  Lipase 24  CXR clear.   EKG without ischemic changes.  Troponin of 7.  D dimer slightly elevated at 0.60; will order CTA. Still awaiting COVID results.   Clinical Course as of May 31 1925  Wynelle Link Jun 01, 2019  1721 SARS Coronavirus 2 Ag: NEGATIVE [MV]    Clinical Course User Index [MV] Tanda Rockers, PA-C   Rapid COVID negative. Will do send out test.  CTA without PE. Does show pulmonary nodule which patient is aware of; getting evaluated by pulmonology for this. Thoracic aorta also measuring 3.9 cm. Pt to follow up with PCP for monitoring.   HR decreased to the 80s with fluids.   Repeat trop of 7.  Low suspicion for ACS at this time. Findings more consistent with likely COVID 19. Pt advised to stay home and self isolate until he receives his results. Tylenol PRN for pain. Strict return precautions discussed. He is requesting Rx for protonix as he has been out, will give 30 day supply. Pt is in agreement with plan and stable for discharge home.   This note was prepared using Dragon voice recognition software and may include unintentional dictation errors due to the inherent limitations of voice recognition software.  Marco Payne was evaluated in Emergency Department on 06/01/2019 for the symptoms described in the history of present illness. He was evaluated in the context of the global COVID-19 pandemic, which necessitated consideration that the patient might be at risk for infection with the SARS-CoV-2 virus that causes COVID-19. Institutional protocols and algorithms that pertain to the evaluation of patients at risk for COVID-19 are in a state of rapid change based on information released by regulatory bodies including the CDC and federal and state organizations. These policies and algorithms were followed during the patient's care in the ED.  MDM Rules/Calculators/A&P                       Final Clinical Impression(s) / ED Diagnoses Final diagnoses:  Nonspecific chest pain  Person under investigation for COVID-19  Pulmonary nodule  Aneurysm of descending thoracic aorta (HCC)    Rx / DC Orders ED Discharge Orders         Ordered    pantoprazole (PROTONIX) 40 MG tablet  Daily     06/01/19 1925           Discharge Instructions     Your labwork was reassuring today. Your CT scan did show an enlargement of the thoracic aorta that will need to be monitored. Please follow up with your PCP  about this.  We have swabbed you for COVID 19 today - please stay home and self isolate until you receive the results. Please take Tylenol as needed for pain.  Return to the ED for any worsening  symptoms including worsening chest pain, worsening shortness of breath, or any other concerning symptoms.        Tanda Rockers, PA-C 06/01/19 1928    Gwyneth Sprout, MD 06/02/19 2150

## 2019-06-05 ENCOUNTER — Encounter: Payer: Self-pay | Admitting: Internal Medicine

## 2019-06-05 ENCOUNTER — Telehealth: Payer: Self-pay | Admitting: Pulmonary Disease

## 2019-06-05 ENCOUNTER — Other Ambulatory Visit: Payer: Self-pay

## 2019-06-05 ENCOUNTER — Ambulatory Visit (HOSPITAL_BASED_OUTPATIENT_CLINIC_OR_DEPARTMENT_OTHER): Payer: Medicare HMO | Admitting: Internal Medicine

## 2019-06-05 ENCOUNTER — Encounter (HOSPITAL_COMMUNITY)
Admission: RE | Admit: 2019-06-05 | Discharge: 2019-06-05 | Disposition: A | Discharge status: Home / Self Care | Payer: Medicare HMO | Source: Ambulatory Visit | Attending: Pulmonary Disease | Admitting: Pulmonary Disease

## 2019-06-05 VITALS — BP 132/88 | HR 86 | Temp 96.6°F | Resp 16 | Ht 69.0 in | Wt 215.8 lb

## 2019-06-05 DIAGNOSIS — I10 Essential (primary) hypertension: Secondary | ICD-10-CM | POA: Diagnosis not present

## 2019-06-05 DIAGNOSIS — Z8249 Family history of ischemic heart disease and other diseases of the circulatory system: Secondary | ICD-10-CM | POA: Diagnosis not present

## 2019-06-05 DIAGNOSIS — Z794 Long term (current) use of insulin: Secondary | ICD-10-CM | POA: Diagnosis not present

## 2019-06-05 DIAGNOSIS — K2970 Gastritis, unspecified, without bleeding: Secondary | ICD-10-CM | POA: Insufficient documentation

## 2019-06-05 DIAGNOSIS — R079 Chest pain, unspecified: Secondary | ICD-10-CM

## 2019-06-05 DIAGNOSIS — R0789 Other chest pain: Secondary | ICD-10-CM

## 2019-06-05 DIAGNOSIS — Z888 Allergy status to other drugs, medicaments and biological substances status: Secondary | ICD-10-CM | POA: Diagnosis not present

## 2019-06-05 DIAGNOSIS — I7789 Other specified disorders of arteries and arterioles: Secondary | ICD-10-CM | POA: Insufficient documentation

## 2019-06-05 DIAGNOSIS — Z87891 Personal history of nicotine dependence: Secondary | ICD-10-CM | POA: Insufficient documentation

## 2019-06-05 DIAGNOSIS — Z833 Family history of diabetes mellitus: Secondary | ICD-10-CM | POA: Diagnosis not present

## 2019-06-05 DIAGNOSIS — E1142 Type 2 diabetes mellitus with diabetic polyneuropathy: Secondary | ICD-10-CM

## 2019-06-05 DIAGNOSIS — E785 Hyperlipidemia, unspecified: Secondary | ICD-10-CM | POA: Insufficient documentation

## 2019-06-05 DIAGNOSIS — K219 Gastro-esophageal reflux disease without esophagitis: Secondary | ICD-10-CM | POA: Insufficient documentation

## 2019-06-05 DIAGNOSIS — E1122 Type 2 diabetes mellitus with diabetic chronic kidney disease: Secondary | ICD-10-CM | POA: Insufficient documentation

## 2019-06-05 DIAGNOSIS — R911 Solitary pulmonary nodule: Secondary | ICD-10-CM | POA: Diagnosis not present

## 2019-06-05 DIAGNOSIS — N1831 Chronic kidney disease, stage 3a: Secondary | ICD-10-CM | POA: Insufficient documentation

## 2019-06-05 DIAGNOSIS — I700 Atherosclerosis of aorta: Secondary | ICD-10-CM | POA: Insufficient documentation

## 2019-06-05 DIAGNOSIS — I129 Hypertensive chronic kidney disease with stage 1 through stage 4 chronic kidney disease, or unspecified chronic kidney disease: Secondary | ICD-10-CM | POA: Insufficient documentation

## 2019-06-05 DIAGNOSIS — E1040 Type 1 diabetes mellitus with diabetic neuropathy, unspecified: Secondary | ICD-10-CM

## 2019-06-05 LAB — GLUCOSE, CAPILLARY: Glucose-Capillary: 162 mg/dL — ABNORMAL HIGH (ref 70–99)

## 2019-06-05 LAB — GLUCOSE, POCT (MANUAL RESULT ENTRY): POC Glucose: 74 mg/dl (ref 70–99)

## 2019-06-05 MED ORDER — PREGABALIN 25 MG PO CAPS: 25.0000 mg | ORAL_CAPSULE | Freq: Two times a day (BID) | ORAL | 3 refills | Status: DC

## 2019-06-05 MED ORDER — PANTOPRAZOLE SODIUM 40 MG PO TBEC: 40.0000 mg | DELAYED_RELEASE_TABLET | Freq: Every day | ORAL | 4 refills | Status: DC

## 2019-06-05 MED ORDER — ATORVASTATIN CALCIUM 10 MG PO TABS: 10.0000 mg | ORAL_TABLET | Freq: Every day | ORAL | 3 refills | Status: DC

## 2019-06-05 MED ORDER — FLUDEOXYGLUCOSE F - 18 (FDG) INJECTION: 10.7000 | Freq: Once | INTRAVENOUS | Status: DC | PRN

## 2019-06-05 MED ORDER — FLUDEOXYGLUCOSE F - 18 (FDG) INJECTION
10.7000 | Freq: Once | INTRAVENOUS | Status: AC | PRN
  Administered 2019-06-05: 10.7 via INTRAVENOUS

## 2019-06-05 NOTE — Telephone Encounter (Signed)
We have received the Super D disc.  Disc placed in Dr. Jayme Cloud mail folder in office.

## 2019-06-05 NOTE — Telephone Encounter (Signed)
Got it.

## 2019-06-05 NOTE — Addendum Note (Signed)
Addended by: Jonah Blue B on: 06/05/2019 05:22 PM   Modules accepted: Level of Service

## 2019-06-05 NOTE — Progress Notes (Signed)
Patient ID: Marco Payne, male    DOB: 03-Feb-1951  MRN: [id number]  CC: New Patient (Initial Visit) and Diabetes   Subjective: Marco Payne is a 69 y.o. male who presents for new pt visit. His concerns today include:  Patient with history of DM type 2 with neuropathy, HL, HTN, GERD, solitary pulmonary nodule  No previous PCP Moved to [place] area 1 yr ago from [facility] Followed by GI, endocrinology and pulmonary since being back in [place].  HTN: Patient was on lisinopril/HCTZ and amlodipine.  However he stopped med x 2-3 mths because he could not get refills from his previous provider and one of the medications was causing a terrible cough for him. Checks BP once a wk. He can not recall his numbers but thinks they have been good He limits salt in his foods No LE edema Endorses CP intermittently for 5 yrs.  Seen in ER recently for same.  Cardiac enzymes and EKG negative.  CTA of the chest was negative for PE.  Incidental finding were unchanged 2.7 cm nodule in the inferior hilar left long, cardiomegaly, enlargement of the main pulmonary artery which could be indicative of pulmonary hypertension, severe irregular aortic atherosclerosis of the descending thoracic aorta and enlargement of the proximal descending thoracic aorta measuring up to 3.9 cm.  States he was told by GI that he has gastritis.  Told by someone else that he may have pancreatitis.  Saw cardiologist Dr. Tomie China 02/2019.  -Had cardiac cath done in [place] 2 yrs ago.  No significant blockage. Pain across lower chest.  worse with activity.  Does not smoke.  Brother died of massive heart attack at age 37.  He stopped taking Lipitor because he thought it was supposed to help with chest pains and it was not.  DM: Followed by endocrinologist Dr. Everardo All.  He was last seen 04/2019.  His A1c at that time was 8.1.  Currently on Novolog 25 tid, Lantus 25.  Reports compliance with medications. Checks BS TID - before meals.  Does not  have a log.  BS before BF 113-126, before dinner 124-160.  Exercise every day for 15 mins -Endorses numbness and tingling in his feet.  He was on gabapentin but he discontinued taking it because he felt it was causing GI upset.  He has a nodule in the left lung and is being followed by pulmonary for that.  He had a PET scan done today in the left lower lobe nodule shows no hypermetabolism.  Radiologist states that features are reassuring for benign etiology of the well-differentiated low-grade neoplasm cannot be ruled out.  Past medical, surgical, social and surgical histories reviewed. Patient Active Problem List   Diagnosis Date Noted  . Enlargement of aortic root (HCC) 06/05/2019  . Aortic atherosclerosis (HCC) 06/05/2019  . Stage 3a chronic kidney disease 06/05/2019  . Gastritis determined by endoscopy 06/05/2019  . Solitary pulmonary nodule on lung CT 04/10/2019  . Chest discomfort 02/03/2019  . Mixed dyslipidemia 02/03/2019  . Chest pain 01/30/2019  . Elevated LDL cholesterol level 12/09/2018  . Gastroesophageal reflux disease 12/06/2018  . Encounter for health maintenance examination with abnormal findings 12/06/2018  . Presbycusis 12/06/2018  . Type 1 diabetes mellitus with diabetic neuropathy, unspecified (HCC) 12/06/2018  . Essential hypertension 12/06/2018     Current Outpatient Medications on File Prior to Visit  Medication Sig Dispense Refill  . insulin aspart (NOVOLOG FLEXPEN) 100 UNIT/ML FlexPen Inject 25 Units into the skin 3 (  three) times daily with meals. And pen needles 4/day 10 pen 11  . Insulin Glargine (LANTUS SOLOSTAR) 100 UNIT/ML Solostar Pen Inject 25 Units into the skin daily. 10 pen 11  . omeprazole (PRILOSEC) 40 MG capsule Take 1 capsule (40 mg total) by mouth 2 (two) times daily. 60 capsule 11   No current facility-administered medications on file prior to visit.    Allergies  Allergen Reactions  . Gabapentin     GI upset  . Lisinopril Cough     Social History   Socioeconomic History  . Marital status: Divorced    Spouse name: Not on file  . Number of children: Not on file  . Years of education: Not on file  . Highest education level: Not on file  Occupational History  . Not on file  Tobacco Use  . Smoking status: Former Smoker    Packs/day: 0.25    Years: 9.00    Pack years: 2.25    Types: Cigarettes    Quit date: 1981    Years since quitting: 40.2  . Smokeless tobacco: Never Used  Substance and Sexual Activity  . Alcohol use: Not Currently  . Drug use: Never  . Sexual activity: Not on file  Other Topics Concern  . Not on file  Social History Narrative  . Not on file   Social Determinants of Health   Financial Resource Strain:   . Difficulty of Paying Living Expenses:   Food Insecurity:   . Worried About Programme researcher, broadcasting/film/video in the Last Year:   . Barista in the Last Year:   Transportation Needs:   . Freight forwarder (Medical):   Marland Kitchen Lack of Transportation (Non-Medical):   Physical Activity:   . Days of Exercise per Week:   . Minutes of Exercise per Session:   Stress:   . Feeling of Stress :   Social Connections:   . Frequency of Communication with Friends and Family:   . Frequency of Social Gatherings with Friends and Family:   . Attends Religious Services:   . Active Member of Clubs or Organizations:   . Attends Banker Meetings:   Marland Kitchen Marital Status:   Intimate Partner Violence:   . Fear of Current or Ex-Partner:   . Emotionally Abused:   Marland Kitchen Physically Abused:   . Sexually Abused:     Family History  Problem Relation Age of Onset  . Diabetes Mother   . Heart attack Brother   . Colon cancer Neg Hx   . Esophageal cancer Neg Hx   . Rectal cancer Neg Hx   . Stomach cancer Neg Hx     Past Surgical History:  Procedure Laterality Date  . CHOLECYSTECTOMY    . COLONOSCOPY     pt states 2016  . UPPER GASTROINTESTINAL ENDOSCOPY     in [place]     ROS: Review of  Systems Negative except as stated above  PHYSICAL EXAM: BP 132/88   Pulse 86   Temp (!) 96.6 F (35.9 C)   Resp 16   Ht 5\' 9"  (1.753 m)   Wt 215 lb 12.8 oz (97.9 kg)   SpO2 95%   BMI 31.87 kg/m   Physical Exam  General appearance - alert, well appearing, older Hispanic male and in no distress Mental status - normal mood, behavior, speech, dress, motor activity, and thought processes Mouth - mucous membranes moist, pharynx normal without lesions Neck - supple, no significant adenopathy Chest -  clear to auscultation, no wheezes, rales or rhonchi, symmetric air entry Heart - normal rate, regular rhythm, normal S1, S2, no murmurs, rubs, clicks or gallops Extremities - peripheral pulses normal, no pedal edema, no clubbing or cyanosis Diabetic Foot Exam - Simple   Simple Foot Form Visual Inspection See comments: Yes Sensation Testing See comments: Yes Pulse Check Posterior Tibialis and Dorsalis pulse intact bilaterally: Yes Comments Toenails are thick and discolored.  He has some decreased sensation on leap exam on the soles of both feet.     CMP Latest Ref Rng & Units 06/01/2019 03/03/2019 01/15/2019  Glucose 70 - 99 mg/dL 709(G) 283(M) 629(U)  BUN 8 - 23 mg/dL 76(L) 22 46(T)  Creatinine 0.61 - 1.24 mg/dL 0.35(W) 6.56(C) 1.27  Sodium 135 - 145 mmol/L 139 140 140  Potassium 3.5 - 5.1 mmol/L 4.4 4.3 4.3  Chloride 98 - 111 mmol/L 105 102 105  CO2 22 - 32 mmol/L 22 23 28   Calcium 8.9 - 10.3 mg/dL 9.1 9.5 9.5  Total Protein 6.5 - 8.1 g/dL 7.5 7.5 7.6  Total Bilirubin 0.3 - 1.2 mg/dL ) 0.6 0.7  Alkaline Phos 38 - 126 U/L 85 98 91  AST 15 - 41 U/L 44(H) 26 22  ALT 0 - 44 U/L 40 21 16   Lipid Panel     Component Value Date/Time   CHOL 129 03/03/2019 0930   TRIG 198 (H) 03/03/2019 0930   HDL 38 (L) 03/03/2019 0930   CHOLHDL 3.4 03/03/2019 0930   CHOLHDL 6 12/06/2018 1007   VLDL 49.8 (H) 12/06/2018 1007   LDLCALC 58 03/03/2019 0930   LDLDIRECT 129.0 12/06/2018 1007     CBC    Component Value Date/Time   WBC 8.9 06/01/2019 1557   RBC 6.10 (H) 06/01/2019 1557   HGB 17.0 06/01/2019 1557   HCT 51.6 06/01/2019 1557   PLT 192 06/01/2019 1557   MCV 84.6 06/01/2019 1557   MCH 27.9 06/01/2019 1557   MCHC 32.9 06/01/2019 1557   RDW 13.8 06/01/2019 1557   LYMPHSABS 0.5 (L) 06/01/2019 1557   MONOABS 0.6 06/01/2019 1557   EOSABS 0.1 06/01/2019 1557   BASOSABS 0.0 06/01/2019 1557    ASSESSMENT AND PLAN: 1. Type 2 diabetes mellitus with diabetic polyneuropathy, with long-term current use of insulin (HCC) Followed by endocrinologist.  Continue current doses of insulins. Encourage him to continue healthy eating habits and regular exercise. He is willing to try Lyrica since he did not tolerate gabapentin - POCT glucose (manual entry) - Microalbumin / creatinine urine ratio - pregabalin (LYRICA) 25 MG capsule; Take 1 capsule (25 mg total) by mouth 2 (two) times daily.  Dispense: 60 capsule; Refill: 3  2. Essential hypertension Not at goal.  Patient discontinued taking Norvasc and lisinopril/HCTZ 2 to 3 months ago and reports that his home blood pressure has been good but he does not recall the numbers.  I request that he check his blood pressure at least twice a week and record the numbers.  He will come to see the clinical pharmacist in 2 weeks for blood pressure recheck.  If blood pressure not at goal, he can be started on Cozaar.  3. Chest pain in adult Apparently has been worked up in 06/03/2019 with cardiac catheterization.  From his history it showed no significant blockage.  I will have him sign a release for [place] to get his records from [place]. -He does have some aortic atherosclerosis on imaging studies done here so  I recommended that he restart the atorvastatin.  We can add aspirin in the near future once he has been on PPI for several weeks  4. Enlargement of aortic root ([place]) 5. Aortic atherosclerosis (HCC) We can add aspirin in the near future  once he has been on PPI for several weeks - atorvastatin (LIPITOR) 10 MG tablet; Take 1 tablet (10 mg total) by mouth daily.  Dispense: 30 tablet; Refill: 3  6. Stage 3a chronic kidney disease GFR on last 2 chemistries was 54.  Will observe.  Advised to stay away from NSAIDs  7. Gastritis determined by endoscopy - pantoprazole (PROTONIX) 40 MG tablet; Take 1 tablet (40 mg total) by mouth daily.  Dispense: 30 tablet; Refill: 4     Patient was given the opportunity to ask questions.  Patient verbalized understanding of the plan and was able to repeat key elements of the plan.  Stratus interpreter used during this encounter. #[id number]   Orders Placed This Encounter  Procedures  . Microalbumin / creatinine urine ratio  . POCT glucose (manual entry)     Requested Prescriptions   Signed Prescriptions Disp Refills  . pregabalin (LYRICA) 25 MG capsule 60 capsule 3    Sig: Take 1 capsule (25 mg total) by mouth 2 (two) times daily.  Marland Kitchen atorvastatin (LIPITOR) 10 MG tablet 30 tablet 3    Sig: Take 1 tablet (10 mg total) by mouth daily.  . pantoprazole (PROTONIX) 40 MG tablet 30 tablet 4    Sig: Take 1 tablet (40 mg total) by mouth daily.    Return in about 7 weeks (around 07/24/2019).  Karle Plumber, MD, FACP

## 2019-06-05 NOTE — Patient Instructions (Signed)
Please give patient an appointment with the clinical pharmacist in 2 weeks for repeat blood pressure check.  Please have patient sign a release for [place] to get his medical records from his previous provider in [place].   Hipertensin en los adultos Hypertension, Adult La presin arterial alta (hipertensin) se produce cuando la fuerza de la sangre bombea a travs de las arterias con mucha fuerza. Las arterias son los vasos sanguneos que transportan la sangre desde el corazn al resto del cuerpo. La hipertensin hace que el corazn haga ms esfuerzo para Insurance account manager y Sears Holdings Corporation que las arterias se Armed forces training and education officer o Multimedia programmer. La hipertensin no tratada o no controlada puede causar infarto de miocardio, insuficiencia cardaca, accidente cerebrovascular, enfermedad renal y otros problemas. Una lectura de la presin arterial consta de un nmero ms alto sobre un nmero ms bajo. En condiciones ideales, la presin arterial debe estar por debajo de 120/80. El primer nmero ("superior") es la presin sistlica. Es la medida de la presin de las arterias cuando el corazn late. El segundo nmero ("inferior") es la presin diastlica. Es la medida de la presin en las arterias cuando el corazn se relaja. Cules son las causas? Se desconoce la causa exacta de esta afeccin. Hay algunas afecciones que causan presin arterial alta o estn relacionadas con ella. Qu incrementa el riesgo? Algunos factores de riesgo de hipertensin estn bajo su control. Los siguientes factores pueden hacer que sea ms propenso a Aeronautical engineer afeccin:  Fumar.  Tener diabetes mellitus tipo 2, colesterol alto, o ambos.  No hacer la cantidad suficiente de actividad fsica o ejercicio.  Tener sobrepeso.  Consumir mucha grasa, azcar, caloras o sal (sodio) en su dieta.  Beber alcohol en exceso. Algunos factores de riesgo para la presin arterial alta pueden ser difciles o imposibles de Multimedia programmer. Algunos de estos  factores son los siguientes:  Tener enfermedad renal crnica.  Tener antecedentes familiares de presin arterial alta.  Edad. Los riesgos aumentan con la edad.  Raza. El riesgo es mayor para las Statistician.  Sexo. Antes de los 45aos, los hombres corren ms [employer]. Despus de los 65aos, las mujeres corren ms [employer].  Tener apnea obstructiva del sueo.  Estrs. Cules son los signos o los sntomas? Es posible que la presin arterial alta puede no cause sntomas. La presin arterial muy alta (crisis hipertensiva) puede provocar:  Dolor de cabeza.  Ansiedad.  Falta de aire.  Hemorragia nasal.  Nuseas y vmitos.  Cambios en la visin.  Dolor de pecho intenso.  Convulsiones. Cmo se diagnostica? Esta afeccin se diagnostica al medir su presin arterial mientras se encuentra sentado, con el brazo apoyado sobre una superficie plana, las piernas sin cruzar y los pies bien apoyados en el piso. El brazalete del tensimetro debe colocarse directamente sobre la piel de la parte superior del brazo y al nivel de su corazn. Debe medirla al [facility] veces en el mismo brazo. Determinadas condiciones pueden causar una diferencia de presin arterial entre el brazo izquierdo y Aeronautical engineer. Ciertos factores pueden provocar que las lecturas de la presin arterial sean inferiores o superiores a lo normal por un perodo corto de tiempo:  Si su presin arterial es ms alta cuando se encuentra en el consultorio del mdico que cuando la mide en su hogar, se denomina "hipertensin de bata blanca". La Harley[employer]son de las personas que tienen esta afeccin no deben ser [employer]el[employer].  Si su presin arterial es ms alta en el  hogar que cuando se encuentra en el consultorio del mdico, se denomina "hipertensin enmascarada". La State Farm de las personas que tienen esta afeccin deben ser medicadas para Chief Technology Officer la presin arterial. Si tiene una lecturas de presin  arterial alta durante una visita o si tiene presin arterial normal con otros factores de riesgo, se le podr pedir que haga lo siguiente:  Que regrese otro da para volver a Chief Technology Officer su presin arterial nuevamente.  Que se controle la presin arterial en su casa durante 1 semana o ms. Si se le diagnostica hipertensin, es posible que se le realicen otros anlisis de sangre o estudios de diagnstico por imgenes para ayudar a su mdico a comprender su riesgo general de tener otras afecciones. Cmo se trata? Esta afeccin se trata haciendo cambios saludables en el estilo de vida, tales como ingerir alimentos saludables, realizar ms ejercicio y reducir el consumo de alcohol. El mdico puede recetarle medicamentos si los cambios en el estilo de vida no son suficientes para Child psychotherapist la presin arterial y si:  Su presin arterial sistlica est por encima de 130.  Su presin arterial diastlica est por encima de 80. La presin arterial deseada puede variar en funcin de las enfermedades, la edad y otros factores personales. Siga estas instrucciones en su casa: Comida y bebida   Siga una dieta con alto contenido de fibras y [place], y con bajo contenido de sodio, Location manager agregada y Physicist, medical. Un ejemplo de plan alimenticio es la dieta DASH (Dietary Approaches to Stop Hypertension, Mtodos alimenticios para detener la hipertensin). Para alimentarse de esta manera: ? Coma mucha fruta y [place]. Trate de que la mitad del plato de cada comida sea de frutas y verduras. ? Coma cereales integrales, como pasta integral, arroz integral o pan integral. Llene aproximadamente un cuarto del plato con cereales integrales. ? Coma y beba productos lcteos con bajo contenido de grasa, como leche descremada o yogur bajo en grasas. ? Evite la ingesta de cortes de carne grasa, carne procesada o curada, y carne de ave con piel. Llene aproximadamente un cuarto del plato con protenas magras, como pescado,  pollo sin piel, frijoles, huevos o tofu. ? Evite ingerir alimentos prehechos y procesados. En general, estos tienen mayor cantidad de sodio, azcar agregada y Wendee Copp.  Reduzca su ingesta diaria de sodio. La mayora de las personas que tienen hipertensin deben comer menos de 1500 mg de sodio por [employer].  No beba alcohol si: ? Su mdico le indica no hacerlo. ? Est embarazada, puede estar embarazada o est tratando de quedar embarazada.  Si bebe alcohol: ? Limite la cantidad que bebe a lo siguiente:  De 0 a 1 medida por da para las mujeres.  De 0 a 2 medidas por da para los hombres. ? Est atento a la cantidad de alcohol que hay en las bebidas que toma. En los [place], una medida equivale a una botella de cerveza de 12oz (333ml), un vaso de vino de 5oz (128ml) o un vaso de una bebida alcohlica de alta graduacin de 1oz (4ml). Estilo de vida   Trabaje con su mdico para mantener un peso saludable o Administrator, Civil Service. Pregntele cul es el peso recomendado para usted.  Haga al menos 39minutos de ejercicio la [employer] Financial de la [place]. Estas actividades pueden incluir caminar, nadar o andar en bicicleta.  Incluya ejercicios para fortalecer sus msculos (ejercicios de resistencia), como Pilates o levantamiento de pesas, como parte de su rutina semanal de ejercicios. Intente realizar  de este tipo de ejercicios al Kellogg a la [place].  No consuma ningn producto que contenga nicotina o tabaco, como cigarrillos, cigarrillos electrnicos y tabaco de Theatre manager. Si necesita ayuda para dejar de fumar, consulte al mdico.  Contrlese la presin arterial en su casa segn las indicaciones del mdico.  Concurra a todas las visitas de seguimiento como se lo haya indicado el mdico. Esto es importante. Medicamentos  Bax[employer] International de venta libre y los recetados solamente como se lo haya indicado el mdico. Siga cuidadosamente las indicaciones. Los medicamentos  para la presin arterial deben tomarse segn las indicaciones.  No omita las dosis de medicamentos para la presin arterial. Si lo hace, estar en riesgo de tener problemas y puede hacer que los medicamentos sean menos eficaces.  Pregntele a su mdico a qu efectos secundarios o reacciones a los Museum/gallery curator. Comunquese con un mdico si:  Piensa que tiene una reaccin a un medicamento que est tomando.  Tiene dolores de cabeza frecuentes (recurrentes).  Se siente mareado.  Tiene hinchazn en los tobillos.  Tiene problemas de visin. Solicite ayuda inmediatamente si:  Siente un dolor de cabeza intenso o confusin.  Siente debilidad inusual o adormecimiento.  Siente que va a desmayarse.  Siente un dolor intenso en el pecho o el abdomen.  Vomita repetidas veces.  Tiene dificultad para respirar. Resumen  La hipertensin se produce cuando la sangre bombea en las arterias con mucha fuerza. Si esta afeccin no se controla, podra correr riesgo de tener complicaciones graves.  La presin arterial deseada puede variar en funcin de las enfermedades, la edad y otros factores personales. Para la [employer], una presin arterial normal es menor que 120/80.  La hipertensin se trata con cambios en el estilo de vida, medicamentos o una combinacin de [place]. Los Danaher Corporation estilo de vida incluyen prdida de peso, ingerir alimentos sanos, seguir una dieta baja en sodio, hacer ms ejercicio y Glass blower/designer consumo de alcohol. Esta informacin no tiene Theme park manager el consejo del mdico. Asegrese de hacerle al mdico cualquier pregunta que tenga. Document Revised: 12/20/2017 Document Reviewed: 12/20/2017 Elsevier Patient Education  2020 [employer].

## 2019-06-06 ENCOUNTER — Other Ambulatory Visit: Payer: Self-pay | Admitting: Internal Medicine

## 2019-06-06 LAB — MICROALBUMIN / CREATININE URINE RATIO
Creatinine, Urine: 54 mg/dL
Microalb/Creat Ratio: 908 mg/g creat — ABNORMAL HIGH (ref 0–29)
Microalbumin, Urine: 490.5 ug/mL

## 2019-06-06 MED ORDER — LOSARTAN POTASSIUM 25 MG PO TABS: 25.0000 mg | ORAL_TABLET | Freq: Every day | ORAL | 3 refills | Status: DC

## 2019-06-06 NOTE — Progress Notes (Signed)
Let pt know that he has protein in the urine which is an indication that diabetes may be affecting the kidneys.  I recommend starting a low dose of a medication called Losartan to help protect the kidneys.  The Losartan is also a blood pressure medication which will help to lower blood pressure. Prescription sent to his pharmacy at [facility].

## 2019-06-09 ENCOUNTER — Ambulatory Visit: Payer: Medicare HMO | Admitting: Pulmonary Disease

## 2019-06-16 ENCOUNTER — Other Ambulatory Visit: Payer: Self-pay | Admitting: Pulmonary Disease

## 2019-06-16 DIAGNOSIS — R911 Solitary pulmonary nodule: Secondary | ICD-10-CM

## 2019-06-16 NOTE — Progress Notes (Signed)
Letter sent to patient with results and MD recommendations, orders for repeat CT placed.

## 2019-06-19 ENCOUNTER — Ambulatory Visit: Payer: Medicare HMO | Attending: Internal Medicine | Admitting: Pharmacist

## 2019-06-19 ENCOUNTER — Other Ambulatory Visit: Payer: Self-pay

## 2019-06-19 ENCOUNTER — Encounter: Payer: Self-pay | Admitting: Pharmacist

## 2019-06-19 VITALS — BP 128/87 | HR 79

## 2019-06-19 DIAGNOSIS — I10 Essential (primary) hypertension: Secondary | ICD-10-CM | POA: Diagnosis not present

## 2019-06-19 DIAGNOSIS — Z0130 Encounter for examination of blood pressure without abnormal findings: Secondary | ICD-10-CM

## 2019-06-19 NOTE — Progress Notes (Signed)
   S:    PCP: Dr. Laural Benes  Patient arrives in good spirits.  Presents to the clinic for BP check.   Patient was referred and last seen by Primary Care Provider on 06/05/19. At that visit, he reported discontinuing his BP medications ~3 months ago with good home BP control. Dr. Laural Benes requested that he be seen for BP check with plans to start losartan if BP is above goal today. Of note, urine microalb came back elevated 06/05/19 and Dr. Laural Benes sent losartan for patient to pick up.   Patient reports adherence with losartan but reveals today that he is taking amlodipine 10 mg written under his previous PCP. He does not have his medications with him today.  Current BP Medications include:  Amlodipine 10 mg daily (patient reported), Losartan 25 mg daily   Dietary habits include: compliant with salt restriction; drinks coffee only in the mornings Exercise habits include: seldom Family / Social history: - FHx:  DM, heart attack - Tobacco: former smoker (quit in 1981) - Alcohol: none currently  O:  Vitals:   06/19/19 1003  BP: 128/87  Pulse: 79    Home BP readings:  Brings log: Most pressures range 130s-140s/70-80s  There are a few outliers with SBPs in the 150s and DBPs in the 90s.   Last 3 Office BP readings: BP Readings from Last 3 Encounters:  06/19/19 128/87  06/05/19 132/88  06/01/19 123/80   BMET    Component Value Date/Time   NA 139 06/01/2019 1557   NA 140 03/03/2019 0930   K 4.4 06/01/2019 1557   CL 105 06/01/2019 1557   CO2 22 06/01/2019 1557   GLUCOSE 131 (H) 06/01/2019 1557   BUN 24 (H) 06/01/2019 1557   BUN 22 03/03/2019 0930   CREATININE 1.35 (H) 06/01/2019 1557   CALCIUM 9.1 06/01/2019 1557   GFRNONAA 54 (L) 06/01/2019 1557   GFRAA >60 06/01/2019 1557    Renal function: Estimated Creatinine Clearance: 62.1 mL/min (A) (by C-G formula based on SCr of 1.35 mg/dL (H)).  Clinical ASCVD: Yes  The ASCVD Risk score Denman George DC Jr., et al., 2013) failed to  calculate for the following reasons:   The valid total cholesterol range is 130 to 320 mg/dL   A/P: Hypertension longstanding currently above goal but improved on current medications. BP Goal = <130/80 mmHg. I have requested he bring in his medication bottles for review. He sees Dr. Laural Benes 07/28/19 for follow-up. His BP looks better today so no changes in medications until we confirm exactly what he is taking.     -Continued current regimen. -Counseled on lifestyle modifications for blood pressure control including reduced dietary sodium, increased exercise, adequate sleep  Results reviewed and written information provided.   Total time in face-to-face counseling 15 minutes.   F/U Clinic Visit in 1 month with PCP.  Butch Penny, PharmD, CPP Clinical Pharmacist [facility] [phone]

## 2019-06-24 ENCOUNTER — Ambulatory Visit (INDEPENDENT_AMBULATORY_CARE_PROVIDER_SITE_OTHER): Payer: Medicare HMO | Admitting: Endocrinology

## 2019-06-24 ENCOUNTER — Encounter: Payer: Self-pay | Admitting: Endocrinology

## 2019-06-24 ENCOUNTER — Other Ambulatory Visit: Payer: Self-pay

## 2019-06-24 VITALS — BP 150/90 | HR 85 | Ht 69.0 in | Wt 216.0 lb

## 2019-06-24 DIAGNOSIS — E1142 Type 2 diabetes mellitus with diabetic polyneuropathy: Secondary | ICD-10-CM | POA: Diagnosis not present

## 2019-06-24 DIAGNOSIS — E1040 Type 1 diabetes mellitus with diabetic neuropathy, unspecified: Secondary | ICD-10-CM | POA: Diagnosis not present

## 2019-06-24 DIAGNOSIS — Z794 Long term (current) use of insulin: Secondary | ICD-10-CM | POA: Diagnosis not present

## 2019-06-24 LAB — POCT GLYCOSYLATED HEMOGLOBIN (HGB A1C): Hemoglobin A1C: 6.5 % — AB (ref 4.0–5.6)

## 2019-06-24 MED ORDER — FREESTYLE LITE TEST VI STRP: 1.0000 | ORAL_STRIP | Freq: Two times a day (BID) | 12 refills | Status: DC

## 2019-06-24 MED ORDER — NOVOLOG FLEXPEN 100 UNIT/ML ~~LOC~~ SOPN: PEN_INJECTOR | SUBCUTANEOUS | 11 refills | Status: DC
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
## 2019-06-24 NOTE — Patient Instructions (Addendum)
Your blood pressure is high today.  Please see your primary care provider soon, to have it rechecked check your blood sugar twice a day.  vary the time of day when you check, between before the 3 meals, and at bedtime.  also check if you have symptoms of your blood sugar being too high or too low.  please keep a record of the readings and bring it to your next appointment here (or you can bring the meter itself).  You can write it on any piece of paper.  please call us sooner if your blood sugar goes below 70, or if you have a lot of readings over 200. Please increase the novolog to 3 times a day (just before each meal) 25-20-30 units, and: please continue the same Lantus.     Please come back for a follow-up appointment in 3 months.    Tu presin arterial est alta hoy. Por favor, consulte a su proveedor de atencin primaria pronto para que lo revisen nuevamente. controle su nivel de azcar en [employer] al da. Vare la hora del da en que lo comprueba, entre antes de las 3 comidas y antes de [place]. Compruebe tambin si tiene sntomas de que su nivel de International aid/development worker en sangre es demasiado alto o demasiado bajo. por favor mantenga un registro de las lecturas y Nurse, adult a su prxima cita aqu (o puede traer Chief Executive Officer). Puedes escribirlo en cualquier hoja de papel. Llmenos antes si su nivel de azcar en sangre es inferior a 70 o si tiene muchas lecturas superiores a 200. Aumente el novolog a 3 veces al da (justo antes de cada comida) 25-20-30 unidades, y: por favor contine con el mismo Lantus. Regrese para una cita de seguimiento en 3 meses.

## 2019-07-01 ENCOUNTER — Other Ambulatory Visit: Payer: Self-pay

## 2019-07-01 DIAGNOSIS — Z794 Long term (current) use of insulin: Secondary | ICD-10-CM

## 2019-07-01 DIAGNOSIS — E1142 Type 2 diabetes mellitus with diabetic polyneuropathy: Secondary | ICD-10-CM

## 2019-07-01 MED ORDER — ACCU-CHEK SOFTCLIX LANCETS MISC: 1.0000 | Freq: Two times a day (BID) | 0 refills | Status: DC

## 2019-07-01 MED ORDER — ACCU-CHEK GUIDE ME W/DEVICE KIT: 1.0000 | PACK | Freq: Two times a day (BID) | 0 refills | Status: DC

## 2019-07-01 MED ORDER — ACCU-CHEK GUIDE VI STRP: 1.0000 | ORAL_STRIP | Freq: Two times a day (BID) | 0 refills | Status: DC

## 2019-07-04 ENCOUNTER — Other Ambulatory Visit: Payer: Self-pay

## 2019-07-04 DIAGNOSIS — E1040 Type 1 diabetes mellitus with diabetic neuropathy, unspecified: Secondary | ICD-10-CM

## 2019-07-04 MED ORDER — BD SWAB SINGLE USE REGULAR PADS: 1.0000 | MEDICATED_PAD | Freq: Two times a day (BID) | 0 refills | Status: AC

## 2019-07-04 MED ORDER — INSULIN PEN NEEDLE 31G X 5 MM MISC: 1.0000 | Freq: Four times a day (QID) | 0 refills | Status: AC

## 2019-07-04 MED ORDER — TRUE METRIX LEVEL 1 LOW VI SOLN: 1.0000 | 0 refills | Status: DC | PRN

## 2019-07-04 MED ORDER — LANTUS SOLOSTAR 100 UNIT/ML ~~LOC~~ SOPN: 25.0000 [IU] | PEN_INJECTOR | Freq: Every day | SUBCUTANEOUS | 11 refills | Status: DC

## 2019-07-04 MED ORDER — TRUE METRIX AIR GLUCOSE METER W/DEVICE KIT: 1.0000 | PACK | Freq: Two times a day (BID) | 0 refills | Status: AC

## 2019-07-04 MED ORDER — TRUEPLUS LANCETS 33G MISC: 1.0000 | Freq: Two times a day (BID) | 0 refills | Status: AC

## 2019-07-04 MED ORDER — NOVOLOG FLEXPEN 100 UNIT/ML ~~LOC~~ SOPN: PEN_INJECTOR | SUBCUTANEOUS | 11 refills | Status: DC

## 2019-07-04 MED ORDER — TRUE METRIX BLOOD GLUCOSE TEST VI STRP: 1.0000 | ORAL_STRIP | Freq: Two times a day (BID) | 0 refills | Status: AC

## 2019-07-11 ENCOUNTER — Other Ambulatory Visit: Payer: Self-pay | Admitting: Internal Medicine

## 2019-07-11 DIAGNOSIS — K2970 Gastritis, unspecified, without bleeding: Secondary | ICD-10-CM

## 2019-07-11 DIAGNOSIS — I700 Atherosclerosis of aorta: Secondary | ICD-10-CM

## 2019-07-11 DIAGNOSIS — E1142 Type 2 diabetes mellitus with diabetic polyneuropathy: Secondary | ICD-10-CM

## 2019-07-11 MED ORDER — PREGABALIN 25 MG PO CAPS: 25.0000 mg | ORAL_CAPSULE | Freq: Two times a day (BID) | ORAL | 3 refills | Status: AC

## 2019-07-11 MED ORDER — PANTOPRAZOLE SODIUM 40 MG PO TBEC: 40.0000 mg | DELAYED_RELEASE_TABLET | Freq: Every day | ORAL | 3 refills | Status: DC

## 2019-07-11 MED ORDER — LOSARTAN POTASSIUM 25 MG PO TABS: 25.0000 mg | ORAL_TABLET | Freq: Every day | ORAL | 3 refills | Status: DC

## 2019-07-11 MED ORDER — AMLODIPINE BESYLATE 10 MG PO TABS: 10.0000 mg | ORAL_TABLET | Freq: Every day | ORAL | 3 refills | Status: DC

## 2019-07-11 MED ORDER — ATORVASTATIN CALCIUM 10 MG PO TABS: 10.0000 mg | ORAL_TABLET | Freq: Every day | ORAL | 3 refills | Status: DC

## 2019-07-11 NOTE — Telephone Encounter (Signed)
Will forward to pcp due to pt is needing Lyrica resent

## 2019-07-11 NOTE — Telephone Encounter (Signed)
Patient wants her rx to transfer to [facility] . Thank you   1. Amlodipine  10mg    2.Atorvastatin  10mg   3. Losatan 25 mg  4. Pantoprazole 40mg   5. Pregabalin 25mg 

## 2019-07-15 ENCOUNTER — Other Ambulatory Visit: Payer: Self-pay

## 2019-07-15 ENCOUNTER — Encounter: Payer: Self-pay | Admitting: Pulmonary Disease

## 2019-07-15 ENCOUNTER — Ambulatory Visit (INDEPENDENT_AMBULATORY_CARE_PROVIDER_SITE_OTHER): Payer: Medicare HMO | Admitting: Pulmonary Disease

## 2019-07-15 VITALS — BP 124/80 | HR 80 | Ht 70.0 in | Wt 223.0 lb

## 2019-07-15 DIAGNOSIS — R911 Solitary pulmonary nodule: Secondary | ICD-10-CM | POA: Diagnosis not present

## 2019-07-15 DIAGNOSIS — R0602 Shortness of breath: Secondary | ICD-10-CM | POA: Diagnosis not present

## 2019-07-15 DIAGNOSIS — E119 Type 2 diabetes mellitus without complications: Secondary | ICD-10-CM

## 2019-07-15 DIAGNOSIS — R918 Other nonspecific abnormal finding of lung field: Secondary | ICD-10-CM

## 2019-07-15 NOTE — Patient Instructions (Signed)
Vamos a repetir un CT del torax en 4 meses  Visita de seguimiento despues del CT   Translation: We are going to repeat a CT scan of the chest in 4 months (patient prefers [facility])  Follow-up visit after CT scan of the chest is done

## 2019-07-15 NOTE — Progress Notes (Signed)
Subjective:    Patient ID: Marco Payne, male    DOB: Mar 29, 1950, 69 y.o.   MRN: [id number]  HPI Patient is a 69 year old Hispanic gentleman from [place] remote former smoker, who was initially evaluated on May 07, 2019 for shortness of breath.  He has also had a left infrahilar density noted.  The patient previously resided in [place] and has relocated to this area less than a year ago.  Most of his care was through [facility] in [place] and [place].  Records have been unavailable to [place].  The patient states that dyspnea is not limiting to him and he has had this for a number of years.  Inhalers do not help it.  During his initial visit we had ordered a dedicated CT chest as he had only had an abdominal and pelvic CT for [place] to evaluate at his initial visit.  Initial chest CT was performed on 22 May 2019 this showed a left lower lobe perihilar lesion with smooth borders measuring 2.6 x 2.3 cm.  Patient states that he has had something noted previously in [place] but he cannot recall.  He states that he used to get frequent imaging.  On 14 March he underwent a CT angio chest due to a visit to the ED with shortness of breath and hematemesis, the lesion was noted to be the same size.  Was followed by PET/CT on 18 March which showed absolutely no uptake on this lesion.  This could indicate benign etiology such as a bronchial cyst.  All of these films have been shown to the patient today.  He does not have any respiratory symptoms today.  Does not endorse any dyspnea.  No cough, sputum production, hemoptysis.  No chest pain.  Overall he feels well and looks well.  Review of Systems A 10 point review of systems was performed and it is as noted above otherwise negative.  Patient Active Problem List   Diagnosis Date Noted  . Enlargement of aortic root ([place]) 06/05/2019  . Aortic atherosclerosis ([place]) 06/05/2019  . Stage 3a chronic kidney  disease ([place]) 06/05/2019  . Gastritis determined by endoscopy 06/05/2019  . Solitary pulmonary nodule on lung CT 04/10/2019  . Hyperlipidemia associated with type 2 diabetes mellitus ([place]) 02/03/2019  . Gastroesophageal reflux disease 12/06/2018  . Encounter for health maintenance examination with abnormal findings 12/06/2018  . Presbycusis 12/06/2018  . Type 1 diabetes mellitus with diabetic neuropathy, unspecified ([place]) 12/06/2018  . Essential hypertension 12/06/2018   . Social History   Tobacco Use  . Smoking status: Former Smoker    Packs/day: 0.25    Years: 9.00    Pack years: 2.25    Types: Cigarettes    Quit date: 1981    Years since quitting: 41.3  . Smokeless tobacco: Never Used  Substance Use Topics  . Alcohol use: Not Currently   Allergies  Allergen Reactions  . Gabapentin     GI upset  . Lisinopril Cough   Medications reviewed, see flowsheet.   Immunization History  Administered Date(s) Administered  . PFIZER(Purple Top)SARS-COV-2 Vaccination 06/13/2019, 07/04/2019      Objective:   Physical Exam BP 124/80 (BP Location: Left Arm, Cuff Size: Large)   Pulse 80   Ht 5\' 10"  (1.778 m)   Wt 223 lb (101.2 kg)   SpO2 98%   BMI 32.00 kg/m  GENERAL: Well-developed well-nourished gentleman in no acute distress, fully ambulatory. HEAD: Normocephalic, atraumatic.  EYES: Pupils equal, round, reactive to light.  No scleral icterus.  MOUTH: Nose/mouth/throat not examined due to masking requirements for COVID 19. NECK: Supple. No thyromegaly. No nodules. No JVD.  Trachea midline. PULMONARY: Lungs clear to auscultation bilaterally.  No adventitious sounds. CARDIOVASCULAR: S1 and S2. Regular rate and rhythm, there is a grade 1/6 systolic ejection murmur at the left sternal border.Marland Kitchen  GASTROINTESTINAL: Nondistended abdomen, soft. MUSCULOSKELETAL: No joint deformity, no clubbing, no edema.  NEUROLOGIC: No overt focal deficit.  Speech is fluent.  Awake alert and oriented  x4. SKIN: Intact,warm,dry.  Limited exam shows no lesions or rashes. PSYCH: Mood and behavior normal.    Assessment & Plan:     ICD-10-CM   1. Solitary pulmonary nodule on lung CT  R91.1    Negative PET/CT Will need follow-up May represent a bronchial cyst  2. Shortness of breath  R06.02    Asymptomatic in this regard today No worsening  3. Abnormal findings on diagnostic imaging of lung  R91.8 CT CHEST WO CONTRAST   As noted above   Orders Placed This Encounter  Procedures  . CT CHEST WO CONTRAST    Standing Status:   Future    Standing Expiration Date:   09/13/2020    Scheduling Instructions:     In 4 months    Order Specific Question:   Preferred imaging location?    Answer:   [facility]    Order Specific Question:   Radiology Contrast Protocol - do NOT remove file path    Answer:   [email or web address]   Discussion:  We have shown the patient all of his films.  The lesion in his left lung appears to be of benign etiology.  We will continue to follow expectantly.  We will repeat a CT chest in 4 months time.  We will see him in follow-up after that CT is performed.  I have also asked him to sign release of information so we can get his records from [place].  Gailen Shelter, MD [facility]   *This note was dictated using voice recognition software/Dragon.  Despite best efforts to proofread, errors can occur which can change the meaning.  Any change was purely unintentional.

## 2019-07-16 ENCOUNTER — Telehealth: Payer: Self-pay | Admitting: Internal Medicine

## 2019-07-16 DIAGNOSIS — K2970 Gastritis, unspecified, without bleeding: Secondary | ICD-10-CM

## 2019-07-16 DIAGNOSIS — I700 Atherosclerosis of aorta: Secondary | ICD-10-CM

## 2019-07-16 MED ORDER — ATORVASTATIN CALCIUM 10 MG PO TABS: 10.0000 mg | ORAL_TABLET | Freq: Every day | ORAL | 3 refills | Status: DC

## 2019-07-16 MED ORDER — AMLODIPINE BESYLATE 10 MG PO TABS: 10.0000 mg | ORAL_TABLET | Freq: Every day | ORAL | 3 refills | Status: DC

## 2019-07-16 MED ORDER — LOSARTAN POTASSIUM 25 MG PO TABS: 25.0000 mg | ORAL_TABLET | Freq: Every day | ORAL | 3 refills | Status: DC

## 2019-07-16 MED ORDER — PANTOPRAZOLE SODIUM 40 MG PO TBEC: 40.0000 mg | DELAYED_RELEASE_TABLET | Freq: Every day | ORAL | 3 refills | Status: DC

## 2019-07-16 NOTE — Telephone Encounter (Signed)
Rx sent 

## 2019-07-16 NOTE — Telephone Encounter (Signed)
Patient came in requesting for all the medication that was prescribed by his PCP be sent to [facility].

## 2019-07-28 ENCOUNTER — Ambulatory Visit: Payer: Medicare HMO | Admitting: Internal Medicine

## 2019-08-12 ENCOUNTER — Encounter: Payer: Self-pay | Admitting: Gastroenterology

## 2019-08-12 ENCOUNTER — Ambulatory Visit: Payer: Medicare HMO | Attending: Internal Medicine | Admitting: Internal Medicine

## 2019-08-12 ENCOUNTER — Other Ambulatory Visit: Payer: Self-pay

## 2019-08-12 DIAGNOSIS — I10 Essential (primary) hypertension: Secondary | ICD-10-CM

## 2019-08-12 DIAGNOSIS — E1121 Type 2 diabetes mellitus with diabetic nephropathy: Secondary | ICD-10-CM

## 2019-08-12 DIAGNOSIS — Z1159 Encounter for screening for other viral diseases: Secondary | ICD-10-CM

## 2019-08-12 DIAGNOSIS — Z23 Encounter for immunization: Secondary | ICD-10-CM

## 2019-08-12 DIAGNOSIS — Z1331 Encounter for screening for depression: Secondary | ICD-10-CM | POA: Diagnosis not present

## 2019-08-12 DIAGNOSIS — K2950 Unspecified chronic gastritis without bleeding: Secondary | ICD-10-CM

## 2019-08-12 NOTE — Progress Notes (Signed)
Pt states his blood sugar this morning was 110 

## 2019-08-12 NOTE — Progress Notes (Signed)
Virtual Visit via Telephone Note Due to current restrictions/limitations of in-office visits due to the COVID-19 pandemic, this scheduled clinical appointment was converted to a telehealth visit  I connected with [facility] on 08/12/19 at  9:40 a.m by telephone and verified that I am speaking with the correct person using two identifiers. I am in my office.  The patient is at home.  Only the patient and myself participated in this encounter.  I discussed the limitations, risks, security and privacy concerns of performing an evaluation and management service by telephone and the availability of in person appointments. I also discussed with the patient that there may be a patient responsible charge related to this service. The patient expressed understanding and agreed to proceed.   History of Present Illness: Patient with history of DM type 2 with neuropathy and macroalbumin, HL, HTN, GERD, solitary pulmonary nodule, CKD 3.  Today's visit is 2 mths f/u visit.  DM:  Saw Dr. Loanne Drilling since last visit.  A1C was at goal.  No changes made in meds Pt with macroalbumin.  I started him on Losartan.  He reports compliance. Last eye exam June 2020.  HTN: checks BP twice/day before meds.  Reports BP always high. BP today was 151/91.  Recent BP check last mth at pulmonary was 124/80 Compliant with Norvasc and Losartan.  Severe Gastritis:  Compliant with Protonix.  Patient reports that this is the only PPI that worked well for him.  Dr. Ardis Hughs did EGD 03/2019.  Plan was for repeat in 3 mths   Positive depression scaring in March of this year.  This is currently not an issue for him.  HM:  Had 2nd of [place] vaccine 07/04/2019.  Does not recall the date of 1st one.  Will bring card on next visit.  Due for Tdap.  Will to get it on next visit Had colonoscopy in [place] 2 yrs ago.  He did sign a release for [place] to get med records from [place].  Outpatient Encounter Medications as of 08/12/2019  Medication  Sig  . Alcohol Swabs (B-D SINGLE USE SWABS REGULAR) PADS 1 each by Does not apply route 2 (two) times daily. E11.9  . amLODipine (NORVASC) 10 MG tablet Take 1 tablet (10 mg total) by mouth daily.  Marland Kitchen atorvastatin (LIPITOR) 10 MG tablet Take 1 tablet (10 mg total) by mouth daily.  . Blood Glucose Calibration (TRUE METRIX LEVEL 1) Low SOLN 1 each by Other route as needed (to calibrate glucometer). E11.9  . Blood Glucose Monitoring Suppl (TRUE METRIX AIR GLUCOSE METER) w/Device KIT 1 each by Does not apply route 2 (two) times daily. E11.9  . glucose blood (TRUE METRIX BLOOD GLUCOSE TEST) test strip 1 each by Other route 2 (two) times daily. E11.9  . insulin aspart (NOVOLOG FLEXPEN) 100 UNIT/ML FlexPen 3 times a day (just before each meal), 25-20-30 units  . insulin glargine (LANTUS SOLOSTAR) 100 UNIT/ML Solostar Pen Inject 25 Units into the skin daily.  . Insulin Pen Needle 31G X 5 MM MISC 1 each by Does not apply route 4 (four) times daily. E11.9  . losartan (COZAAR) 25 MG tablet Take 1 tablet (25 mg total) by mouth daily.  . pantoprazole (PROTONIX) 40 MG tablet Take 1 tablet (40 mg total) by mouth daily.  . pregabalin (LYRICA) 25 MG capsule Take 1 capsule (25 mg total) by mouth 2 (two) times daily.  . TRUEplus Lancets 33G MISC 1 each by Does not apply route 2 (two)  times daily. E11.9   No facility-administered encounter medications on file as of 08/12/2019.    Observations/Objective: Lab Results  Component Value Date   HGBA1C 6.5 (A) 06/24/2019   Microalbumin - 908  Depression screen [facility] 2/9 06/05/2019 04/25/2019  Decreased Interest 3 0  Down, Depressed, Hopeless 3 0  PHQ - 2 Score 6 0  Altered sleeping 3 -  Tired, decreased energy 3 -  Change in appetite 3 -  Feeling bad or failure about yourself  2 -  Trouble concentrating 2 -  Moving slowly or fidgety/restless 2 -  Suicidal thoughts 0 -  PHQ-9 Score 21 -    Assessment and Plan: 1. Essential hypertension Patient reporting home  blood pressure readings above goal but BP checks at recent specialist visit was okay.  We will have him come back to see a clinical pharmacist in 2 weeks.  Told to bring his blood pressure monitoring device with him so that we can check blood pressure with his device and ours.  He will continue current dose of amlodipine and Cozaar.  He should stop at the laboratory after his visit with the clinical pharmacist for metabolic panel to check creatinine and GFR since being started on Cozaar and before we increase the dose. - Comprehensive metabolic panel; Future  2. Controlled type 2 diabetes mellitus with macroalbuminuric diabetic nephropathy ([place]) At goal and followed by endocrinology. - Ambulatory referral to Ophthalmology  3. Need for Tdap vaccination To be given at his visit with the clinical pharmacist in 2 weeks  4. Need for hepatitis C screening test - Hepatitis C Antibody; Future  5. Other chronic gastritis without hemorrhage Continue Protonix.  I will have them schedule a follow-up visit for him to see Dr. Ardis Hughs for his repeat endoscopy.  If this looks good, on next visit I will add low-dose aspirin as mentioned on last visit with his diagnosis of aortic atherosclerosis - Ambulatory referral to Gastroenterology  6.  Positive depression screen   F/u with Lurena Joiner in 2 wks bring BP monitor and COVID vaccine card  Follow Up Instructions: 4 mths   I discussed the assessment and treatment plan with the patient. The patient was provided an opportunity to ask questions and all were answered. The patient agreed with the plan and demonstrated an understanding of the instructions.   The patient was advised to call back or seek an in-person evaluation if the symptoms worsen or if the condition fails to improve as anticipated.  I provided 30 minutes of non-face-to-face time during this encounter.   Karle Plumber, MD

## 2019-08-26 ENCOUNTER — Ambulatory Visit: Payer: Medicare HMO | Admitting: Pharmacist

## 2019-08-27 ENCOUNTER — Encounter: Payer: Self-pay | Admitting: Pharmacist

## 2019-08-27 ENCOUNTER — Ambulatory Visit: Payer: Medicare HMO | Attending: Internal Medicine | Admitting: Pharmacist

## 2019-08-27 ENCOUNTER — Other Ambulatory Visit: Payer: Self-pay

## 2019-08-27 DIAGNOSIS — I10 Essential (primary) hypertension: Secondary | ICD-10-CM

## 2019-08-27 DIAGNOSIS — R768 Other specified abnormal immunological findings in serum: Secondary | ICD-10-CM | POA: Diagnosis not present

## 2019-08-27 DIAGNOSIS — Z1159 Encounter for screening for other viral diseases: Secondary | ICD-10-CM | POA: Diagnosis not present

## 2019-08-27 NOTE — Progress Notes (Signed)
   S:    PCP: Dr. Laural Benes  Patient arrives in good spirits. Presents to the clinic for hypertension evaluation, counseling, and management.  Patient was referred and last seen by Primary Care Provider on 08/12/2019.   PMH: HTN, aortic atherosclerosis, DM, CKD, GERD, dyslipidemia   Medication adherence: reports.  Current BP Medications include:  Losartan 25 mg daily, amlodipine 10 mg daily   Antihypertensives tried in the past include: lisinopril-HCTZ (cough)  Dietary habits include: compliant with salt restriction; denies excessive intake of caffeine  Exercise habits include: limited d/t gastritis  Family / Social history:  - FHx: DM, [place] - Former smoker (quit in 1981) - Alcohol: denies current use   O:  Vitals:   08/27/19 1022  BP: 129/86  Pulse: 77   Home BP readings:  Brings cuff and log of home readings SBPs above goal in 142-150 range.  Home cuff: not resting before taking and reports he takes his BP when he feels gastric pain b/c he believes this causes his BP to be elevated.   Last 3 Office BP readings: BP Readings from Last 3 Encounters:  08/27/19 129/86  07/15/19 124/80  06/24/19 (!) 150/90   BMET    Component Value Date/Time   NA 139 06/01/2019 1557   NA 140 03/03/2019 0930   K 4.4 06/01/2019 1557   CL 105 06/01/2019 1557   CO2 22 06/01/2019 1557   GLUCOSE 131 (H) 06/01/2019 1557   BUN 24 (H) 06/01/2019 1557   BUN 22 03/03/2019 0930   CREATININE 1.35 (H) 06/01/2019 1557   CALCIUM 9.1 06/01/2019 1557   GFRNONAA 54 (L) 06/01/2019 1557   GFRAA >60 06/01/2019 1557    Renal function: CrCl cannot be calculated (Patient's most recent lab result is older than the maximum 21 days allowed.).  Clinical ASCVD: aortic atherosclerosis  The ASCVD Risk score Denman George DC Jr., et al., 2013) failed to calculate for the following reasons:   The valid total cholesterol range is 130 to 320 mg/dL  A/P: Hypertension longstanding currently close to goal on current  medications. BP Goal = < 130/80 mmHg. Medication adherence reported. Pt reports elevated home pressures but his technique may be contributing to falsely elevated home BP. I have counseled him on proper home BP checking technique. He had a BP at the Pulmonologist's office of 124/80. Will hold off on changes today.    -Continued current regimen.  -F/u labs ordered - labs entered by PCP; taken today -Covid vaccine record updated -No Boostrix in clinic. Pt to return in 1 week for BP recheck and we will give Boostrix if stocked by the clinic at that time.  -Counseled on lifestyle modifications for blood pressure control including reduced dietary sodium, increased exercise, adequate sleep.  Results reviewed and written information provided.   Total time in face-to-face counseling 15 minutes.   F/U Clinic Visit in 1 week.  Butch Penny, PharmD, CPP Clinical Pharmacist [facility] [phone]

## 2019-08-28 LAB — COMPREHENSIVE METABOLIC PANEL
ALT: 19 IU/L (ref 0–44)
AST: 23 IU/L (ref 0–40)
Albumin/Globulin Ratio: 1.6 (ref 1.2–2.2)
Albumin: 4.4 g/dL (ref 3.8–4.8)
Alkaline Phosphatase: 86 IU/L (ref 48–121)
BUN/Creatinine Ratio: 18 (ref 10–24)
BUN: 23 mg/dL (ref 8–27)
Bilirubin Total: 0.7 mg/dL (ref 0.0–1.2)
CO2: 20 mmol/L (ref 20–29)
Calcium: 9.5 mg/dL (ref 8.6–10.2)
Chloride: 105 mmol/L (ref 96–106)
Creatinine, Ser: 1.25 mg/dL (ref 0.76–1.27)
GFR calc Af Amer: 69 mL/min/{1.73_m2} (ref >59)
GFR calc non Af Amer: 60 mL/min/{1.73_m2} (ref >59)
Globulin, Total: 2.7 g/dL (ref 1.5–4.5)
Glucose: 130 mg/dL — ABNORMAL HIGH (ref 65–99)
Potassium: 4.1 mmol/L (ref 3.5–5.2)
Sodium: 140 mmol/L (ref 134–144)
Total Protein: 7.1 g/dL (ref 6.0–8.5)

## 2019-08-28 LAB — HEPATITIS C ANTIBODY: Hep C Virus Ab: >11 s/co ratio — ABNORMAL HIGH (ref 0.0–0.9)

## 2019-08-29 ENCOUNTER — Other Ambulatory Visit: Payer: Self-pay | Admitting: Family Medicine

## 2019-08-29 DIAGNOSIS — R768 Other specified abnormal immunological findings in serum: Secondary | ICD-10-CM

## 2019-09-02 LAB — HCV RNA QUANT RFLX ULTRA OR GENOTYP: HCV Quant Baseline: NOT DETECTED IU/mL

## 2019-09-02 LAB — SPECIMEN STATUS REPORT

## 2019-09-03 ENCOUNTER — Encounter: Payer: Self-pay | Admitting: Pharmacist

## 2019-09-03 ENCOUNTER — Other Ambulatory Visit: Payer: Self-pay

## 2019-09-03 ENCOUNTER — Ambulatory Visit: Payer: Medicare HMO | Attending: Internal Medicine | Admitting: Pharmacist

## 2019-09-03 ENCOUNTER — Ambulatory Visit (AMBULATORY_SURGERY_CENTER): Payer: Medicare HMO | Admitting: *Deleted

## 2019-09-03 ENCOUNTER — Telehealth: Payer: Self-pay | Admitting: *Deleted

## 2019-09-03 VITALS — Ht 70.0 in | Wt 221.0 lb

## 2019-09-03 VITALS — BP 116/72 | HR 78

## 2019-09-03 DIAGNOSIS — E1040 Type 1 diabetes mellitus with diabetic neuropathy, unspecified: Secondary | ICD-10-CM | POA: Diagnosis not present

## 2019-09-03 DIAGNOSIS — Z23 Encounter for immunization: Secondary | ICD-10-CM | POA: Diagnosis not present

## 2019-09-03 DIAGNOSIS — I700 Atherosclerosis of aorta: Secondary | ICD-10-CM | POA: Diagnosis not present

## 2019-09-03 DIAGNOSIS — K2970 Gastritis, unspecified, without bleeding: Secondary | ICD-10-CM

## 2019-09-03 DIAGNOSIS — I10 Essential (primary) hypertension: Secondary | ICD-10-CM

## 2019-09-03 MED ORDER — ATORVASTATIN CALCIUM 10 MG PO TABS: 10.0000 mg | ORAL_TABLET | Freq: Every day | ORAL | 3 refills | Status: AC

## 2019-09-03 MED ORDER — PANTOPRAZOLE SODIUM 40 MG PO TBEC: 40.0000 mg | DELAYED_RELEASE_TABLET | Freq: Every day | ORAL | 3 refills | Status: AC

## 2019-09-03 MED ORDER — LOSARTAN POTASSIUM 25 MG PO TABS: 25.0000 mg | ORAL_TABLET | Freq: Every day | ORAL | 3 refills | Status: AC

## 2019-09-03 MED ORDER — LANTUS SOLOSTAR 100 UNIT/ML ~~LOC~~ SOPN: 25.0000 [IU] | PEN_INJECTOR | Freq: Every day | SUBCUTANEOUS | 11 refills | Status: AC

## 2019-09-03 MED ORDER — NOVOLOG FLEXPEN 100 UNIT/ML ~~LOC~~ SOPN: PEN_INJECTOR | SUBCUTANEOUS | 11 refills | Status: DC

## 2019-09-03 MED ORDER — AMLODIPINE BESYLATE 10 MG PO TABS: 10.0000 mg | ORAL_TABLET | Freq: Every day | ORAL | 3 refills | Status: AC

## 2019-09-03 NOTE — Patient Instructions (Signed)
Thank you for coming to see us today.   Blood pressure today looks good.  Continue taking blood pressure medications as prescribed.   Limiting salt and caffeine, as well as exercising as able for at least 30 minutes for 5 days out of the week, can also help you lower your blood pressure.  Take your blood pressure at home if you are able. Please write down these numbers and bring them to your visits.  If you have any questions about medications, please call me [phone].  Luke  

## 2019-09-03 NOTE — Progress Notes (Signed)

## 2019-09-03 NOTE — Telephone Encounter (Signed)
Hi Dr Christella Hartigan,  I saw Mr Marco Payne in pre-visit for up-coming follow-up EGD.  He continues to have a lot of pain and bloating as relayed by his interpretor.  Just wanted you to be aware when he has EGD in the event something is lost in translation at time of EGD. He is worried it is liver or pancreas related and I  assured him you would send him in the right direction if EGD does not reveal source of pain.

## 2019-09-03 NOTE — Progress Notes (Signed)
   S:    PCP: Dr. Laural Benes  Patient arrives in good spirits. Presents to the clinic for hypertension evaluation, counseling, and management.  Patient was referred and last seen by Primary Care Provider on 08/12/2019. I saw him on 08/27/19 and his BP had improved.   PMH: HTN, aortic atherosclerosis, DM, CKD, GERD, dyslipidemia   Medication adherence: reports.  Current BP Medications include:  Losartan 25 mg daily, amlodipine 10 mg daily   Antihypertensives tried in the past include: lisinopril-HCTZ (cough)  Dietary habits include: compliant with salt restriction; denies excessive intake of caffeine  Exercise habits include: limited d/t gastritis  Family / Social history:  - FHx: DM, MI - Former smoker (quit in 1981) - Alcohol: denies current use   O:  Vitals:   09/03/19 0953  BP: 116/72  Pulse: 78   Home BP readings:  Brings cuff and log of home readings. Checks at home at the end of the day.  BP range at home: 125-168/76-95  Last 3 Office BP readings: BP Readings from Last 3 Encounters:  09/03/19 116/72  08/27/19 129/86  07/15/19 124/80   BMET    Component Value Date/Time   NA 140 08/27/2019 1024   K 4.1 08/27/2019 1024   CL 105 08/27/2019 1024   CO2 20 08/27/2019 1024   GLUCOSE 130 (H) 08/27/2019 1024   GLUCOSE 131 (H) 06/01/2019 1557   BUN 23 08/27/2019 1024   CREATININE 1.25 08/27/2019 1024   CALCIUM 9.5 08/27/2019 1024   GFRNONAA 60 08/27/2019 1024   GFRAA 69 08/27/2019 1024    Renal function: CrCl cannot be calculated (Unknown ideal weight.).  Clinical ASCVD: aortic atherosclerosis  The ASCVD Risk score Denman George DC Jr., et al., 2013) failed to calculate for the following reasons:   The valid total cholesterol range is 130 to 320 mg/dL  A/P: Hypertension longstanding currently at goal on current medications. BP Goal = < 130/80 mmHg. Medication adherence reported. His BP today looks better than his BP reported at home. I have asked him to make an appointment  with PCP to discuss further modification of regimen.  -Continued current regimen.  -Boostrix given. -Counseled on lifestyle modifications for blood pressure control including reduced dietary sodium, increased exercise, adequate sleep.  Results reviewed and written information provided.   Total time in face-to-face counseling 15 minutes.   F/U w/ PCP.   Butch Penny, PharmD, CPP Clinical Pharmacist [facility] [phone]

## 2019-09-04 ENCOUNTER — Encounter: Payer: Self-pay | Admitting: Internal Medicine

## 2019-09-04 DIAGNOSIS — R768 Other specified abnormal immunological findings in serum: Secondary | ICD-10-CM | POA: Insufficient documentation

## 2019-09-04 NOTE — Telephone Encounter (Signed)
Got it, thanks for the information

## 2019-09-04 NOTE — Progress Notes (Signed)
Let patient know that the confirmatory test for hepatitis C was negative.  This means that he does not have hepatitis C.   FYI I had to have the lab tech print the results as it did not show up in the EMR.  I will sign this copy and send it to be scanned into the EMR.

## 2019-09-05 ENCOUNTER — Telehealth: Payer: Self-pay

## 2019-09-05 NOTE — Telephone Encounter (Signed)
[employer] Alethia Berthold  Id#  [id number] contacted pt to go over lab results pt didn't answer left a detailed vm informing pt of results and if he has any questions or concerns to give a call

## 2019-09-10 ENCOUNTER — Ambulatory Visit (AMBULATORY_SURGERY_CENTER): Payer: Medicare HMO | Admitting: Gastroenterology

## 2019-09-10 ENCOUNTER — Other Ambulatory Visit: Payer: Self-pay

## 2019-09-10 ENCOUNTER — Encounter: Payer: Self-pay | Admitting: Gastroenterology

## 2019-09-10 VITALS — BP 123/81 | HR 69 | Temp 97.7°F | Resp 18 | Ht 70.0 in | Wt 221.0 lb

## 2019-09-10 DIAGNOSIS — K2950 Unspecified chronic gastritis without bleeding: Secondary | ICD-10-CM

## 2019-09-10 DIAGNOSIS — K209 Esophagitis, unspecified without bleeding: Secondary | ICD-10-CM | POA: Diagnosis not present

## 2019-09-10 DIAGNOSIS — K2970 Gastritis, unspecified, without bleeding: Secondary | ICD-10-CM

## 2019-09-10 DIAGNOSIS — K449 Diaphragmatic hernia without obstruction or gangrene: Secondary | ICD-10-CM | POA: Diagnosis not present

## 2019-09-10 DIAGNOSIS — K219 Gastro-esophageal reflux disease without esophagitis: Secondary | ICD-10-CM | POA: Diagnosis not present

## 2019-09-10 MED ORDER — FAMOTIDINE 20 MG PO TABS
20.0000 mg | ORAL_TABLET | ORAL | 11 refills | Status: DC
Start: 2019-09-10 — End: 2019-09-11

## 2019-09-10 MED ORDER — SODIUM CHLORIDE 0.9 % IV SOLN: 500.0000 mL | Freq: Once | INTRAVENOUS | Status: DC

## 2019-09-10 NOTE — Progress Notes (Signed)
To PACU, VSS. Reportto Charity fundraiser.tb VO for additional meds.

## 2019-09-10 NOTE — Progress Notes (Signed)
Called to room to assist during endoscopic procedure.  Patient ID and intended procedure confirmed with present staff. Received instructions for my participation in the procedure from the performing physician.  

## 2019-09-10 NOTE — Op Note (Signed)
[place] Patient Name: Marco Payne Procedure Date: 09/10/2019 9:59 AM MRN: [id number] Endoscopist: Milus Banister , MD Age: 69 Referring MD:  Date of Birth: 1950-12-27 Gender: Male Account #: [id number] Procedure:                Upper GI endoscopy Indications:              Dyspepsia; EGD January 2020 Dr. Natalia Leatherwood at "[facility] in [place]. Indications "heartburn". Findings 3 cm                            hiatal hernia. Diffuse severe inflammation with                            erosions and erythema throughout his stomach                            biopsies were taken normal duodenum pathology                            report described "chronic gastritis and intestinal                            metaplasia.Negative for H. Pylori." EGD Dr. Ardis Hughs                            03/2019 showed diffuse severe gastritis, path                            "chronic gastritis with intestinal metaplasia, neg                            for H. pylori." Medicines:                Monitored Anesthesia Care Procedure:                Pre-Anesthesia Assessment:                           - Prior to the procedure, a History and Physical                            was performed, and patient medications and                            allergies were reviewed. The patient's tolerance of                            previous anesthesia was also reviewed. The risks  and benefits of the procedure and the sedation                            options and risks were discussed with the patient.                            All questions were answered, and informed consent                            was obtained. Prior Anticoagulants: The patient has                            taken no previous anticoagulant or antiplatelet                            agents. ASA Grade Assessment: II - A patient  with                            mild systemic disease. After reviewing the risks                            and benefits, the patient was deemed in                            satisfactory condition to undergo the procedure.                           After obtaining informed consent, the endoscope was                            passed under direct vision. Throughout the                            procedure, the patient's blood pressure, pulse, and                            oxygen saturations were monitored continuously. The                            Endoscope was introduced through the mouth, and                            advanced to the second part of duodenum. The upper                            GI endoscopy was accomplished without difficulty.                            The patient tolerated the procedure well. Scope In: Scope Out: Findings:                 Small hiatal hernia.  Single spoke of typical appearing reflux related                            erosive esophagitis above the GE junction.                           Moderate inflammation characterized by erythema,                            friability and granularity was found in the entire                            examined stomach. Biopsies were taken with a cold                            forceps for histology.                           The exam was otherwise without abnormality. Complications:            No immediate complications. Estimated blood loss:                            None. Estimated Blood Loss:     Estimated blood loss: none. Impression:               - Hiatal hernia.                           - Acid related esophagitis.                           - Persistent moderate pan gastritis, biopsied                            extensively.                           - The examination was otherwise normal. Recommendation:           - Patient has a contact number available for                             emergencies. The signs and symptoms of potential                            delayed complications were discussed with the                            patient. Return to normal activities tomorrow.                            Written discharge instructions were provided to the                            patient.                           -  Resume previous diet.                           - Continue present medications. Make sure to be                            taking protonix 40mg  pills one pill 20-30 min                            before breakfast and dinner meals. Also please                            start pepcid (famotidine) 20mg  pills, one pill at                            bedtime nightly.                           - Await pathology results. , MD 09/10/2019 10:17:00 AM This report has been signed electronically.

## 2019-09-10 NOTE — Patient Instructions (Signed)
handouts given : gastritis ,  esophogitis Start taking 20mg  pepcid each morning 30 min before breakfast Resume previous diet  continue present medications  YOU HAD AN ENDOSCOPIC PROCEDURE TODAY AT THE [facility]:   Refer to the procedure report that was given to you for any specific questions about what was found during the examination.  If the procedure report does not answer your questions, please call your gastroenterologist to clarify.  If you requested that your care partner not be given the details of your procedure findings, then the procedure report has been included in a sealed envelope for you to review at your convenience later.  YOU SHOULD EXPECT: Some feelings of bloating in the abdomen. Passage of more gas than usual.  Walking can help get rid of the air that was put into your GI tract during the procedure and reduce the bloating. If you had a lower endoscopy (such as a colonoscopy or flexible sigmoidoscopy) you may notice spotting of blood in your stool or on the toilet paper. If you underwent a bowel prep for your procedure, you may not have a normal bowel movement for a few days.  Please Note:  You might notice some irritation and congestion in your nose or some drainage.  This is from the oxygen used during your procedure.  There is no need for concern and it should clear up in a day or so.  SYMPTOMS TO REPORT IMMEDIATELY:    Following upper endoscopy (EGD)  Vomiting of blood or coffee ground material  New chest pain or pain under the shoulder blades  Painful or persistently difficult swallowing  New shortness of breath  Fever of 100F or higher  Black, tarry-looking stools  For urgent or emergent issues, a gastroenterologist can be reached at any hour by calling [phone]. Do not use MyChart messaging for urgent concerns.    DIET:  We do recommend a small meal at first, but then you may proceed to your regular diet.  Drink plenty of fluids but you  should avoid alcoholic beverages for 24 hours.  ACTIVITY:  You should plan to take it easy for the rest of today and you should NOT DRIVE or use heavy machinery until tomorrow (because of the sedation medicines used during the test).    FOLLOW UP: Our staff will call the number listed on your records 48-72 hours following your procedure to check on you and address any questions or concerns that you may have regarding the information given to you following your procedure. If we do not reach you, we will leave a message.  We will attempt to reach you two times.  During this call, we will ask if you have developed any symptoms of COVID 19. If you develop any symptoms (ie: fever, flu-like symptoms, shortness of breath, cough etc.) before then, please call [phone].  If you test positive for Covid 19 in the 2 weeks post procedure, please call and report this information to [phone].    If any biopsies were taken you will be contacted by phone or by letter within the next 1-3 weeks.  Please call us at [phone] if you have not heard about the biopsies in 3 weeks.    SIGNATURES/CONFIDENTIALITY: You and/or your care partner have signed paperwork which will be entered into your electronic medical record.  These signatures attest to the fact that that the information above on your After Visit Summary has been reviewed and is understood.  Full responsibility of  the confidentiality of this discharge information lies with you and/or your care-partner. 

## 2019-09-11 ENCOUNTER — Other Ambulatory Visit: Payer: Self-pay

## 2019-09-11 MED ORDER — FAMOTIDINE 20 MG PO TABS: 20.0000 mg | ORAL_TABLET | Freq: Every day | ORAL | 3 refills | Status: AC

## 2019-09-12 ENCOUNTER — Telehealth: Payer: Self-pay

## 2019-09-12 ENCOUNTER — Telehealth: Payer: Self-pay | Admitting: *Deleted

## 2019-09-12 NOTE — Telephone Encounter (Signed)
First attempt follow up call to pt, lm on vm 

## 2019-09-12 NOTE — Telephone Encounter (Signed)
  Follow up Call-  Call back number 09/10/2019 04/07/2019  Post procedure Call Back phone  # [phone]  Permission to leave phone message Yes Yes    No answer at 2nd attempt follow up phone call.  Left message on voicemail.

## 2019-09-15 ENCOUNTER — Other Ambulatory Visit: Payer: Self-pay

## 2019-09-15 DIAGNOSIS — R1013 Epigastric pain: Secondary | ICD-10-CM

## 2019-09-15 DIAGNOSIS — K2970 Gastritis, unspecified, without bleeding: Secondary | ICD-10-CM

## 2019-09-17 ENCOUNTER — Other Ambulatory Visit (INDEPENDENT_AMBULATORY_CARE_PROVIDER_SITE_OTHER): Payer: Medicare HMO

## 2019-09-17 DIAGNOSIS — R1013 Epigastric pain: Secondary | ICD-10-CM | POA: Diagnosis not present

## 2019-09-17 DIAGNOSIS — K2970 Gastritis, unspecified, without bleeding: Secondary | ICD-10-CM | POA: Diagnosis not present

## 2019-09-17 LAB — CBC WITH DIFFERENTIAL/PLATELET
Basophils Absolute: 0.1 10*3/uL (ref 0.0–0.1)
Basophils Relative: 0.9 % (ref 0.0–3.0)
Eosinophils Absolute: 0.3 10*3/uL (ref 0.0–0.7)
Eosinophils Relative: 4.5 % (ref 0.0–5.0)
HCT: 47.5 % (ref 39.0–52.0)
Hemoglobin: 15.9 g/dL (ref 13.0–17.0)
Lymphocytes Relative: 37.4 % (ref 12.0–46.0)
Lymphs Abs: 2.2 10*3/uL (ref 0.7–4.0)
MCHC: 33.4 g/dL (ref 30.0–36.0)
MCV: 83.6 fl (ref 78.0–100.0)
Monocytes Absolute: 0.5 10*3/uL (ref 0.1–1.0)
Monocytes Relative: 9 % (ref 3.0–12.0)
Neutro Abs: 2.8 10*3/uL (ref 1.4–7.7)
Neutrophils Relative %: 48.2 % (ref 43.0–77.0)
Platelets: 175 10*3/uL (ref 150.0–400.0)
RBC: 5.69 Mil/uL (ref 4.22–5.81)
RDW: 15.3 % (ref 11.5–15.5)
WBC: 5.9 10*3/uL (ref 4.0–10.5)

## 2019-09-17 LAB — VITAMIN B12: Vitamin B-12: 380 pg/mL (ref 211–911)

## 2019-09-17 LAB — FOLATE: Folate: 12.1 ng/mL (ref >5.9)

## 2019-09-19 LAB — INTRINSIC FACTOR ANTIBODIES: Intrinsic Factor: NEGATIVE

## 2019-09-23 ENCOUNTER — Encounter: Payer: Self-pay | Admitting: Internal Medicine

## 2019-09-23 ENCOUNTER — Other Ambulatory Visit: Payer: Self-pay

## 2019-09-23 ENCOUNTER — Ambulatory Visit: Payer: Medicare HMO | Attending: Internal Medicine | Admitting: Internal Medicine

## 2019-09-23 VITALS — BP 122/83 | HR 84 | Temp 97.2°F | Resp 16 | Wt 219.2 lb

## 2019-09-23 DIAGNOSIS — R911 Solitary pulmonary nodule: Secondary | ICD-10-CM | POA: Diagnosis not present

## 2019-09-23 DIAGNOSIS — Z1331 Encounter for screening for depression: Secondary | ICD-10-CM

## 2019-09-23 DIAGNOSIS — I10 Essential (primary) hypertension: Secondary | ICD-10-CM

## 2019-09-23 DIAGNOSIS — E785 Hyperlipidemia, unspecified: Secondary | ICD-10-CM

## 2019-09-23 DIAGNOSIS — E1169 Type 2 diabetes mellitus with other specified complication: Secondary | ICD-10-CM

## 2019-09-23 DIAGNOSIS — K2940 Chronic atrophic gastritis without bleeding: Secondary | ICD-10-CM

## 2019-09-23 DIAGNOSIS — E1121 Type 2 diabetes mellitus with diabetic nephropathy: Secondary | ICD-10-CM | POA: Diagnosis not present

## 2019-09-23 LAB — GLUCOSE, POCT (MANUAL RESULT ENTRY): POC Glucose: 122 mg/dl — AB (ref 70–99)

## 2019-09-23 NOTE — Patient Instructions (Signed)
Please sign a release for [place] to get your colonoscopy report from your previous provider in [place].

## 2019-09-23 NOTE — Progress Notes (Signed)
Patient ID: Marco Payne, male    DOB: [birth year]  MRN: [id number]  CC: Diabetes and Hypertension   Subjective: Marco Payne is a 69 y.o. male who presents for chronic disease management His concerns today include:  Patient with history of DM type2with neuropathy and macroalbumin, HL, HTN, GERD, solitary pulmonary nodule, CKD 3, false pos hep C ab.   DIABETES TYPE 2 Last A1C:   Results for orders placed or performed in visit on 09/23/19  POCT glucose (manual entry)  Result Value Ref Range   POC Glucose 122 (A) 70 - 99 mg/dl    Lab Results  Component Value Date   HGBA1C 6.5 (A) 06/24/2019   Followed by Dr. Loanne Drilling.  Has appt with him tomorrow which he will keep Med Adherence:  '[x]'$  Yes    Lantus 25 units and Novolog 25 units with meals; suppose to be Novolog 25-20-30 units Medication side effects:  '[]'$  Yes    '[x]'$  No Home Monitoring?  '[x]'$  Yes before BF Home glucose results range: 112-170.  He does not have a log with him Diet Adherence:  Doing better Exercise: '[]'$  Yes    '[x]'$  No - no exercise in the last mth.   Hypoglycemic episodes?: '[]'$  Yes    '[x]'$  No Numbness of the feet? '[x]'$  Yes    '[]'$  No Retinopathy hx? '[]'$  Yes    '[]'$  No Last eye exam: referred in May.  No appt as yet Comments: macroalbumin as 900+ in March 2021.  Started on Losartan  HYPERTENSION Currently taking: see medication list Med Adherence: '[x]'$  Yes    '[]'$  No Medication side effects: '[]'$  Yes    '[x]'$  No Adherence with salt restriction: '[x]'$  Yes    '[]'$  No Home Monitoring?: '[]'$  Yes    '[x]'$  No Monitoring Frequency: '[]'$  Yes    '[]'$  No Home BP results range: '[]'$  Yes    '[]'$  No SOB? '[]'$  Yes    '[x]'$  No Chest Pain?: '[]'$  Yes    '[x]'$  No Leg swelling?: '[]'$  Yes    '[x]'$  No Headaches?: '[]'$  Yes    '[x]'$  No Dizziness? '[]'$  Yes    '[x]'$  No Comments:   HL:  Taking and tolerating Lipitor  Positive Dep Screen 05/2019.  Pt reports this is not an issue at this time.  Reports intermittent issues at home and lacks transportation which adds to  stress.  Had repeat EDG 08/2019 due to severe gastritis seen on EGD 03/2019.  This revealed persistent moderate pan gastritis.  Check for IF ab, Vit I34 and Folic acid level were nl.  Continue PPI  Solitary lung nodule: He had seen Dr. Leonides Schanz for this earlier this year.  Subsequently saw Dr. Patsey Berthold.  Had pet imaging which revealed that the 2.7 cm left lower lobe lesion showed no hypermetabolism.  Features reassuring for benign etiology although well differentiated low-grade neoplasm cannot be entirely ruled out.  Continue close follow-up was recommended.  Patient denies any shortness of breath or cough.  HM:  Reports having had c-scope 18 mths ago in [place].  No polyps removed. Patient Active Problem List   Diagnosis Date Noted  . False positive serological test for hepatitis C 09/04/2019  . Controlled type 2 diabetes mellitus with macroalbuminuric diabetic nephropathy ([place]) 08/12/2019  . Enlargement of aortic root ([place]) 06/05/2019  . Aortic atherosclerosis ([place]) 06/05/2019  . Stage 3a chronic kidney disease 06/05/2019  . Gastritis determined by endoscopy 06/05/2019  . Solitary pulmonary nodule on lung CT 04/10/2019  .  Chest discomfort 02/03/2019  . Mixed dyslipidemia 02/03/2019  . Chest pain 01/30/2019  . Elevated LDL cholesterol level 12/09/2018  . Gastroesophageal reflux disease 12/06/2018  . Encounter for health maintenance examination with abnormal findings 12/06/2018  . Presbycusis 12/06/2018  . Type 1 diabetes mellitus with diabetic neuropathy, unspecified ([name]) 12/06/2018  . Essential hypertension 12/06/2018     Current Outpatient Medications on File Prior to Visit  Medication Sig Dispense Refill  . Alcohol Swabs (B-D SINGLE USE SWABS REGULAR) PADS 1 each by Does not apply route 2 (two) times daily. E11.9 200 each 0  . amLODipine (NORVASC) 10 MG tablet Take 1 tablet (10 mg total) by mouth daily. 90 tablet 3  . atorvastatin (LIPITOR) 10 MG tablet Take 1 tablet (10 mg total) by mouth  daily. 30 tablet 3  . Blood Glucose Calibration (TRUE METRIX LEVEL 1) Low SOLN 1 each by Other route as needed (to calibrate glucometer). E11.9 1 each 0  . Blood Glucose Monitoring Suppl (TRUE METRIX AIR GLUCOSE METER) w/Device KIT 1 each by Does not apply route 2 (two) times daily. E11.9 1 kit 0  . famotidine (PEPCID) 20 MG tablet Take 1 tablet (20 mg total) by mouth at bedtime. 90 tablet 3  . glucose blood (TRUE METRIX BLOOD GLUCOSE TEST) test strip 1 each by Other route 2 (two) times daily. E11.9 180 each 0  . insulin aspart (NOVOLOG FLEXPEN) 100 UNIT/ML FlexPen 3 times a day (just before each meal), 25-20-30 units 10 pen 11  . insulin glargine (LANTUS SOLOSTAR) 100 UNIT/ML Solostar Pen Inject 25 Units into the skin daily. 10 pen 11  . Insulin Pen Needle 31G X 5 MM MISC 1 each by Does not apply route 4 (four) times daily. E11.9 360 each 0  . losartan (COZAAR) 25 MG tablet Take 1 tablet (25 mg total) by mouth daily. 90 tablet 3  . pantoprazole (PROTONIX) 40 MG tablet Take 1 tablet (40 mg total) by mouth daily. 90 tablet 3  . pregabalin (LYRICA) 25 MG capsule Take 1 capsule (25 mg total) by mouth 2 (two) times daily. 60 capsule 3  . TRUEplus Lancets 33G MISC 1 each by Does not apply route 2 (two) times daily. E11.9 180 each 0   No current facility-administered medications on file prior to visit.    Allergies  Allergen Reactions  . Gabapentin     GI upset  . Lisinopril Cough    Social History   Socioeconomic History  . Marital status: Divorced    Spouse name: Not on file  . Number of children: Not on file  . Years of education: Not on file  . Highest education level: Not on file  Occupational History  . Not on file  Tobacco Use  . Smoking status: Former Smoker    Packs/day: 0.25    Years: 9.00    Pack years: 2.25    Types: Cigarettes    Quit date: 1981    Years since quitting: 40.5  . Smokeless tobacco: Never Used  Vaping Use  . Vaping Use: Never used  Substance and Sexual  Activity  . Alcohol use: Not Currently  . Drug use: Never  . Sexual activity: Not on file  Other Topics Concern  . Not on file  Social History Narrative  . Not on file   Social Determinants of Health   Financial Resource Strain:   . Difficulty of Paying Living Expenses:   Food Insecurity:   . Worried About [employer] of  Food in the Last Year:   . [place] in the Last Year:   Transportation Needs:   . Film/video editor (Medical):   Marland Kitchen Lack of Transportation (Non-Medical):   Physical Activity:   . Days of Exercise per Week:   . Minutes of Exercise per Session:   Stress:   . Feeling of Stress :   Social Connections:   . Frequency of Communication with Friends and Family:   . Frequency of Social Gatherings with Friends and Family:   . Attends Religious Services:   . Active Member of Clubs or Organizations:   . Attends Archivist Meetings:   Marland Kitchen Marital Status:   Intimate Partner Violence:   . Fear of Current or Ex-Partner:   . Emotionally Abused:   Marland Kitchen Physically Abused:   . Sexually Abused:     Family History  Problem Relation Age of Onset  . Diabetes Mother   . Heart attack Brother   . Colon cancer Neg Hx   . Esophageal cancer Neg Hx   . Rectal cancer Neg Hx   . Stomach cancer Neg Hx     Past Surgical History:  Procedure Laterality Date  . CHOLECYSTECTOMY    . COLONOSCOPY     pt states 2016  . UPPER GASTROINTESTINAL ENDOSCOPY     in [place]     ROS: Review of Systems Negative except as stated above  PHYSICAL EXAM: BP 122/83   Pulse 84   Temp (!) 97.2 F (36.2 C)   Resp 16   Wt 219 lb 3.2 oz (99.4 kg)   SpO2 97%   BMI 31.45 kg/m   Physical Exam  General appearance - alert, well appearing, and in no distress Mental status - normal mood, behavior, speech, dress, motor activity, and thought processes Mouth - mucous membranes moist, pharynx normal without lesions Neck - supple, no significant adenopathy Chest - clear to auscultation,  no wheezes, rales or rhonchi, symmetric air entry Heart - normal rate, regular rhythm, normal S1, S2, no murmurs, rubs, clicks or gallops Extremities - peripheral pulses normal, no pedal edema, no clubbing or cyanosis  Depression screen [facility] 2/9 06/05/2019 04/25/2019  Decreased Interest 3 0  Down, Depressed, Hopeless 3 0  PHQ - 2 Score 6 0  Altered sleeping 3 -  Tired, decreased energy 3 -  Change in appetite 3 -  Feeling bad or failure about yourself  2 -  Trouble concentrating 2 -  Moving slowly or fidgety/restless 2 -  Suicidal thoughts 0 -  PHQ-9 Score 21 -    CMP Latest Ref Rng & Units 08/27/2019 06/01/2019 03/03/2019  Glucose 65 - 99 mg/dL 130(H) 131(H) 234(H)  BUN 8 - 27 mg/dL 23 24(H) 22  Creatinine 0.76 - 1.27 mg/dL 1.25 1.35(H) 1.35(H)  Sodium 134 - 144 mmol/L 140 139 140  Potassium 3.5 - 5.2 mmol/L 4.1 4.4 4.3  Chloride 96 - 106 mmol/L 105 105 102  CO2 20 - 29 mmol/L '20 22 23  '$ Calcium 8.6 - 10.2 mg/dL 9.5 9.1 9.5  Total Protein 6.0 - 8.5 g/dL 7.1 7.5 7.5  Total Bilirubin 0.0 - 1.2 mg/dL 0.7 1.7(H) 0.6  Alkaline Phos 48 - 121 IU/L 86 85 98  AST 0 - 40 IU/L 23 44(H) 26  ALT 0 - 44 IU/L 19 40 21   Lipid Panel     Component Value Date/Time   CHOL 129 03/03/2019 0930   TRIG 198 (H) 03/03/2019 0930  HDL 38 (L) 03/03/2019 0930   CHOLHDL 3.4 03/03/2019 0930   CHOLHDL 6 12/06/2018 1007   VLDL 49.8 (H) 12/06/2018 1007   LDLCALC 58 03/03/2019 0930   LDLDIRECT 129.0 12/06/2018 1007    CBC    Component Value Date/Time   WBC 5.9 09/17/2019 1112   RBC 5.69 09/17/2019 1112   HGB 15.9 09/17/2019 1112   HCT 47.5 09/17/2019 1112   PLT 175.0 09/17/2019 1112   MCV 83.6 09/17/2019 1112   MCH 27.9 06/01/2019 1557   MCHC 33.4 09/17/2019 1112   RDW 15.3 09/17/2019 1112   LYMPHSABS 2.2 09/17/2019 1112   MONOABS 0.5 09/17/2019 1112   EOSABS 0.3 09/17/2019 1112   BASOSABS 0.1 09/17/2019 1112    ASSESSMENT AND PLAN: 1. Type 2 diabetes mellitus with macroalbuminuric diabetic  nephropathy (HCC) Fasting blood sugar readings close to goal.  He will continue current dose of Lantus and NovoLog.  He has a follow-up visit with endocrinology tomorrow.  Healthy eating habits and regular exercise encouraged. - POCT glucose (manual entry) - Ambulatory referral to Ophthalmology  2. Essential hypertension Close to goal.  Continue Cozaar, amlodipine  3. Hyperlipidemia associated with type 2 diabetes mellitus (HCC) Continue atorvastatin  4. Positive depression screening Patient reports that this is not a major issue for him at this time  5. Atrophic gastritis without hemorrhage Z58 and folic acid levels normal.  Screen for intrinsic factor antibodies negative.  Continue PPI.  Advised against use of NSAIDs.  6. Solitary pulmonary nodule on lung CT Followed by pulmonary with PET scan done 3 months ago revealing no hypermetabolism   Has Medicare for 2-3 yrs. No Medicare AWV in past.  Need AWV scheduled.  Patient was given the opportunity to ask questions.  Patient verbalized understanding of the plan and was able to repeat key elements of the plan.  [employer] interpreter used during this encounter. #[id number]  Orders Placed This Encounter  Procedures  . Ambulatory referral to Ophthalmology  . POCT glucose (manual entry)     Requested Prescriptions    No prescriptions requested or ordered in this encounter    Return in about 4 months (around 01/24/2020) for f/u with [facility] in 3 weeks for initial AWV.  Karle Plumber, MD, FACP

## 2019-09-24 ENCOUNTER — Encounter: Payer: Self-pay | Admitting: Endocrinology

## 2019-09-24 ENCOUNTER — Ambulatory Visit (INDEPENDENT_AMBULATORY_CARE_PROVIDER_SITE_OTHER): Payer: Medicare HMO | Admitting: Endocrinology

## 2019-09-24 VITALS — BP 154/82 | HR 76 | Ht 70.0 in | Wt 217.2 lb

## 2019-09-24 DIAGNOSIS — E1040 Type 1 diabetes mellitus with diabetic neuropathy, unspecified: Secondary | ICD-10-CM

## 2019-09-24 DIAGNOSIS — E1121 Type 2 diabetes mellitus with diabetic nephropathy: Secondary | ICD-10-CM

## 2019-09-24 LAB — POCT GLYCOSYLATED HEMOGLOBIN (HGB A1C): Hemoglobin A1C: 6.7 % — AB (ref 4.0–5.6)

## 2019-09-24 MED ORDER — NOVOLOG FLEXPEN 100 UNIT/ML ~~LOC~~ SOPN: PEN_INJECTOR | SUBCUTANEOUS | 11 refills | Status: AC
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
## 2019-09-24 NOTE — Patient Instructions (Addendum)
Your blood pressure is high today.  Please see your primary care provider soon, to have it rechecked check your blood sugar twice a day.  vary the time of day when you check, between before the 3 meals, and at bedtime.  also check if you have symptoms of your blood sugar being too high or too low.  please keep a record of the readings and bring it to your next appointment here (or you can bring the meter itself).  You can write it on any piece of paper.  please call us sooner if your blood sugar goes below 70, or if you have a lot of readings over 200. Please change the novolog to 3 times a day (just before each meal) 25-20-30 units, and: please continue the same Lantus.   Please come back for a follow-up appointment in 3 months.  Tu presin arterial est alta hoy. Por favor, consulte a su proveedor de atencin primaria pronto para que lo revisen nuevamente. controle su nivel de azcar en [employer] al da. Vare la hora del da en que lo comprueba, entre antes de las 3 comidas y antes de [place]. Compruebe tambin si tiene sntomas de que su nivel de International aid/development worker en sangre es demasiado alto o demasiado bajo. por favor mantenga un registro de las lecturas y Nurse, adult a su prxima cita aqu (o puede traer Chief Executive Officer). Puede escribirlo en cualquier hoja de papel. Llmenos antes si su nivel de azcar en sangre es inferior a 70 o si tiene muchas lecturas superiores a 200. Cambie el novolog a 3 veces al da (justo antes de cada comida) 25-20-30 unidades, y: por favor contine con el mismo Lantus. Regrese para una cita de seguimiento en 3 meses.

## 2019-10-14 ENCOUNTER — Encounter: Payer: Self-pay | Admitting: Pharmacist

## 2019-10-14 ENCOUNTER — Ambulatory Visit: Payer: Medicare HMO | Attending: Internal Medicine | Admitting: Pharmacist

## 2019-10-14 ENCOUNTER — Other Ambulatory Visit: Payer: Self-pay

## 2019-10-14 DIAGNOSIS — Z0000 Encounter for general adult medical examination without abnormal findings: Secondary | ICD-10-CM | POA: Diagnosis not present

## 2019-10-14 DIAGNOSIS — Z23 Encounter for immunization: Secondary | ICD-10-CM | POA: Diagnosis not present

## 2019-10-14 NOTE — Progress Notes (Signed)
Subjective:   Marco Payne is a 69 y.o. male who presents for an Initial Medicare Annual Wellness Visit.  Objective:    Today's Vitals   10/14/19 0959 10/14/19 1001  BP: 112/76   Pulse: 71   Temp: 98.6 F (37 C)   SpO2: 97%   Weight: (!) 218 lb 9.6 oz (99.2 kg)   Height: _0  (1.727 m)   PainSc: 0-No pain 0-No pain   Body mass index is 33.24 kg/m.  Advanced Directives 10/14/2019  Does Patient Have a Medical Advance Directive? No  Would patient like information on creating a medical advance directive? No - Patient declined    Current Medications (verified) Outpatient Encounter Medications as of 10/14/2019  Medication Sig   Alcohol Swabs (B-D SINGLE USE SWABS REGULAR) PADS 1 each by Does not apply route 2 (two) times daily. E11.9   amLODipine (NORVASC) 10 MG tablet Take 1 tablet (10 mg total) by mouth daily.   atorvastatin (LIPITOR) 10 MG tablet Take 1 tablet (10 mg total) by mouth daily.   Blood Glucose Monitoring Suppl (TRUE METRIX AIR GLUCOSE METER) w/Device KIT 1 each by Does not apply route 2 (two) times daily. E11.9   famotidine (PEPCID) 20 MG tablet Take 1 tablet (20 mg total) by mouth at bedtime.   glucose blood (TRUE METRIX BLOOD GLUCOSE TEST) test strip 1 each by Other route 2 (two) times daily. E11.9   insulin aspart (NOVOLOG FLEXPEN) 100 UNIT/ML FlexPen 3 times a day (just before each meal), 25-20-30 units   insulin glargine (LANTUS SOLOSTAR) 100 UNIT/ML Solostar Pen Inject 25 Units into the skin daily.   Insulin Pen Needle 31G X 5 MM MISC 1 each by Does not apply route 4 (four) times daily. E11.9   losartan (COZAAR) 25 MG tablet Take 1 tablet (25 mg total) by mouth daily.   pantoprazole (PROTONIX) 40 MG tablet Take 1 tablet (40 mg total) by mouth daily.   pregabalin (LYRICA) 25 MG capsule Take 1 capsule (25 mg total) by mouth 2 (two) times daily.   TRUEplus Lancets 33G MISC 1 each by Does not apply route 2 (two) times daily. E11.9    [DISCONTINUED] Blood Glucose Calibration (TRUE METRIX LEVEL 1) Low SOLN 1 each by Other route as needed (to calibrate glucometer). E11.9   No facility-administered encounter medications on file as of 10/14/2019.    Allergies (verified) Gabapentin and Lisinopril   History: Past Medical History:  Diagnosis Date   Diabetes mellitus without complication ([facility])    type 1    GERD (gastroesophageal reflux disease)    Hyperlipidemia    Hypertension    Pancreatitis    Past Surgical History:  Procedure Laterality Date   CHOLECYSTECTOMY     COLONOSCOPY     pt states 2016   UPPER GASTROINTESTINAL ENDOSCOPY     in [place]    Family History  Problem Relation Age of Onset   Diabetes Mother    Heart attack Brother    Colon cancer Neg Hx    Esophageal cancer Neg Hx    Rectal cancer Neg Hx    Stomach cancer Neg Hx    Social History   Socioeconomic History   Marital status: Divorced    Spouse name: Not on file   Number of children: Not on file   Years of education: Not on file   Highest education level: Not on file  Occupational History   Not on file  Tobacco Use   Smoking  status: Former Smoker    Packs/day: 0.25    Years: 9.00    Pack years: 2.25    Types: Cigarettes    Quit date: 1981    Years since quitting: 40.5   Smokeless tobacco: Never Used  Scientific laboratory technician Use: Never used  Substance and Sexual Activity   Alcohol use: Not Currently   Drug use: Never   Sexual activity: Not on file  Other Topics Concern   Not on file  Social History Narrative   Not on file   Social Determinants of Health   Financial Resource Strain:    Difficulty of Paying Living Expenses:   Food Insecurity:    Worried About Charity fundraiser in the Last Year:    Arboriculturist in the Last Year:   Transportation Needs:    Film/video editor (Medical):    Lack of Transportation (Non-Medical):   Physical Activity:    Days of Exercise per Week:     Minutes of Exercise per Session:   Stress:    Feeling of Stress :   Social Connections:    Frequency of Communication with Friends and Family:    Frequency of Social Gatherings with Friends and Family:    Attends Religious Services:    Active Member of Clubs or Organizations:    Attends Music therapist:    Marital Status:     Tobacco Counseling Counseling given: Not Answered   Clinical Intake:  Pre-visit preparation completed: No  Pain : No/denies pain Pain Score: 0-No pain  BMI - recorded: 33.24 (It is evident that pt is overweight but he is also muscular) Nutritional Status: BMI > 30  Obese Diabetes: Yes CBG done?: No Did pt. bring in CBG monitor from home?: No  How often do you need to have someone help you when you read instructions, pamphlets, or other written materials from your doctor or pharmacy?: 1 - Never What is the last grade level you completed in school?: 8th (DR)  Diabetic? Yes   Interpreter Needed?: Yes   Activities of Daily Living In your present state of health, do you have any difficulty performing the following activities: 10/14/2019  Hearing? Y  Comment Uses hearing aids  Vision? Y  Comment Reports some blurred vision attributed to DM  Difficulty concentrating or making decisions? N  Walking or climbing stairs? N  Dressing or bathing? N  Doing errands, shopping? N  Preparing Food and eating ? N  Using the Toilet? N  In the past six months, have you accidently leaked urine? N  Do you have problems with loss of bowel control? N  Managing your Medications? N  Managing your Finances? N  Housekeeping or managing your Housekeeping? N   Patient Care Team: Ladell Pier, MD as PCP - General (Internal Medicine)  Indicate any recent Medical Services you may have received from other than [facility] providers in the past year (date may be approximate).     Assessment:   This is a routine wellness examination for  [facility].  Vision:  Sees his eye doctor later this week.   Dietary issues and exercise activities discussed: Current Exercise Habits: Home exercise routine, Type of exercise: strength training/weights, Frequency (Times/Week): 1 (Rarely - has not exercised at home for a couple of weeks), Intensity: Moderate  Goals   None    Depression Screen PHQ 2/9 Scores 10/14/2019 06/05/2019 04/25/2019  PHQ - 2 Score 0 6 0  PHQ- 9  Score - 21 -    Of note, pt screened positive earlier this year with his PCP. At that time, he denied this being an issue. Today, he denies depressive symptoms.   Fall Risk Fall Risk  10/14/2019 06/05/2019 04/25/2019  Falls in the past year? 0 0 0  Number falls in past yr: 0 - -  Injury with Fall? 0 - -  Risk for fall due to : No Fall Risks - -  Follow up Falls evaluation completed;Education provided;Falls prevention discussed - -   Any stairs in or around the home? No  If so, are there any without handrails? No Home free of loose throw rugs in walkways, pet beds, electrical cords, etc? Yes  Adequate lighting in your home to reduce risk of falls? Yes   ASSISTIVE DEVICES UTILIZED TO PREVENT FALLS:  Life alert? No  Use of a cane, walker or w/c? No  Grab bars in the bathroom? No  Shower chair or bench in shower? No  Elevated toilet seat or a handicapped toilet? No   TIMED UP AND GO:  Was the test performed? Yes .  Length of time to ambulate 10 feet: 5 sec.   Gait steady and fast without use of assistive device  Cognitive Function: MMSE - Mini Mental State Exam 10/14/2019  Orientation to time 4  Orientation to Place 5  Registration 3  Attention/ Calculation 5  Recall 3  Language- name 2 objects 2  Language- repeat 1  Language- follow 3 step command 3  Language- read & follow direction 1  Write a sentence 1  Copy design 1  Total score 29        Immunizations Immunization History  Administered Date(s) Administered   PFIZER SARS-COV-2 Vaccination  06/13/2019, 07/04/2019   Pneumococcal Conjugate-13 10/14/2019   Tdap 09/03/2019    TDAP status: Up to date Flu Vaccine status: Up to date Pneumococcal vaccine status: Completed during today's visit. Covid-19 vaccine status: Completed vaccines  Qualifies for Shingles Vaccine? Yes   Zostavax completed No   Shingrix Completed?: No.    Education has been provided regarding the importance of this vaccine. Patient has been advised to call insurance company to determine out of pocket expense if they have not yet received this vaccine. Advised may also receive vaccine at local pharmacy or Health Dept. Verbalized acceptance and understanding.  Screening Tests Health Maintenance  Topic Date Due   OPHTHALMOLOGY EXAM  Never done   INFLUENZA VACCINE  10/19/2019   HEMOGLOBIN A1C  03/26/2020   FOOT EXAM  09/23/2020   PNA vac Low Risk Adult (2 of 2 - PPSV23) 10/13/2020   COLONOSCOPY  08/18/2024   TETANUS/TDAP  09/02/2029   COVID-19 Vaccine  Completed   Hepatitis C Screening  Completed    Health Maintenance  Health Maintenance Due  Topic Date Due   OPHTHALMOLOGY EXAM  Never done    Colorectal cancer screening: Completed 2016 per pt.. Repeat every 10 years  Lung Cancer Screening: (Low Dose CT Chest recommended if Age 58-80 years, 30 pack-year currently smoking OR have quit w/in 15years.) does not qualify.   Additional Screening:  Hepatitis C Screening: does qualify; Completed 08/27/2019  Vision Screening: Recommended annual ophthalmology exams for early detection of glaucoma and other disorders of the eye. Is the patient up to date with their annual eye exam?  No  Who is the provider or what is the name of the office in which the patient attends annual eye exams? Dr. Katy Fitch -  has an appointment later this week.  Dental Screening: Recommended annual dental exams for proper oral hygiene  Community Resource Referral / Chronic Care Management: CRR required this visit?  No   CCM  required this visit?  No   Plan:   I have personally reviewed and noted the following in the patients chart:    Medical and social history  Use of alcohol, tobacco or illicit drugs   Current medications and supplements  Functional ability and status  Nutritional status  Physical activity  Advanced directives  List of other physicians  Hospitalizations, surgeries, and ER visits in previous 12 months  Vitals  Screenings to include cognitive, depression, and falls  Referrals and appointments  In addition, I have reviewed and discussed with patient certain preventive protocols, quality metrics, and best practice recommendations. A written personalized care plan for preventive services as well as general preventive health recommendations were provided to patient.     [place], RPH-CPP   10/14/2019

## 2019-11-06 DIAGNOSIS — E119 Type 2 diabetes mellitus without complications: Secondary | ICD-10-CM | POA: Diagnosis not present

## 2019-11-06 DIAGNOSIS — H40023 Open angle with borderline findings, high risk, bilateral: Secondary | ICD-10-CM | POA: Diagnosis not present

## 2019-11-06 DIAGNOSIS — Z961 Presence of intraocular lens: Secondary | ICD-10-CM | POA: Diagnosis not present

## 2019-11-06 DIAGNOSIS — H16223 Keratoconjunctivitis sicca, not specified as Sjogren's, bilateral: Secondary | ICD-10-CM | POA: Diagnosis not present

## 2019-11-06 LAB — HM DIABETES EYE EXAM

## 2019-11-10 DIAGNOSIS — E1140 Type 2 diabetes mellitus with diabetic neuropathy, unspecified: Secondary | ICD-10-CM | POA: Diagnosis not present

## 2019-11-10 DIAGNOSIS — R7309 Other abnormal glucose: Secondary | ICD-10-CM | POA: Diagnosis not present

## 2019-11-10 DIAGNOSIS — N179 Acute kidney failure, unspecified: Secondary | ICD-10-CM | POA: Diagnosis not present

## 2019-11-10 DIAGNOSIS — R0989 Other specified symptoms and signs involving the circulatory and respiratory systems: Secondary | ICD-10-CM | POA: Diagnosis not present

## 2019-11-10 DIAGNOSIS — E089 Diabetes mellitus due to underlying condition without complications: Secondary | ICD-10-CM | POA: Diagnosis not present

## 2019-11-10 DIAGNOSIS — I129 Hypertensive chronic kidney disease with stage 1 through stage 4 chronic kidney disease, or unspecified chronic kidney disease: Secondary | ICD-10-CM | POA: Diagnosis not present

## 2019-11-10 DIAGNOSIS — E11649 Type 2 diabetes mellitus with hypoglycemia without coma: Secondary | ICD-10-CM | POA: Diagnosis not present

## 2019-11-10 DIAGNOSIS — E860 Dehydration: Secondary | ICD-10-CM | POA: Diagnosis not present

## 2019-11-10 DIAGNOSIS — E1122 Type 2 diabetes mellitus with diabetic chronic kidney disease: Secondary | ICD-10-CM | POA: Diagnosis not present

## 2019-11-10 DIAGNOSIS — Z743 Need for continuous supervision: Secondary | ICD-10-CM | POA: Diagnosis not present

## 2019-11-10 DIAGNOSIS — E0800 Diabetes mellitus due to underlying condition with hyperosmolarity without nonketotic hyperglycemic-hyperosmolar coma (NKHHC): Secondary | ICD-10-CM | POA: Diagnosis not present

## 2019-11-10 DIAGNOSIS — R4182 Altered mental status, unspecified: Secondary | ICD-10-CM | POA: Diagnosis not present

## 2019-11-10 DIAGNOSIS — E162 Hypoglycemia, unspecified: Secondary | ICD-10-CM | POA: Diagnosis not present

## 2019-11-10 DIAGNOSIS — Z794 Long term (current) use of insulin: Secondary | ICD-10-CM | POA: Diagnosis not present

## 2019-11-10 DIAGNOSIS — E785 Hyperlipidemia, unspecified: Secondary | ICD-10-CM | POA: Diagnosis not present

## 2019-11-10 DIAGNOSIS — I10 Essential (primary) hypertension: Secondary | ICD-10-CM | POA: Diagnosis not present

## 2019-11-10 DIAGNOSIS — K219 Gastro-esophageal reflux disease without esophagitis: Secondary | ICD-10-CM | POA: Diagnosis not present

## 2019-11-10 DIAGNOSIS — S0990XA Unspecified injury of head, initial encounter: Secondary | ICD-10-CM | POA: Diagnosis not present

## 2019-11-10 DIAGNOSIS — E119 Type 2 diabetes mellitus without complications: Secondary | ICD-10-CM | POA: Diagnosis not present

## 2019-11-10 DIAGNOSIS — R5383 Other fatigue: Secondary | ICD-10-CM | POA: Diagnosis not present

## 2019-11-10 DIAGNOSIS — F329 Major depressive disorder, single episode, unspecified: Secondary | ICD-10-CM | POA: Diagnosis not present

## 2019-11-26 ENCOUNTER — Ambulatory Visit: Payer: Medicare HMO | Admitting: Gastroenterology

## 2019-11-26 DIAGNOSIS — K429 Umbilical hernia without obstruction or gangrene: Secondary | ICD-10-CM | POA: Diagnosis not present

## 2019-11-26 DIAGNOSIS — I10 Essential (primary) hypertension: Secondary | ICD-10-CM | POA: Diagnosis not present

## 2019-11-26 DIAGNOSIS — Z79899 Other long term (current) drug therapy: Secondary | ICD-10-CM | POA: Diagnosis not present

## 2019-11-26 DIAGNOSIS — Z9049 Acquired absence of other specified parts of digestive tract: Secondary | ICD-10-CM | POA: Diagnosis not present

## 2019-11-26 DIAGNOSIS — M545 Low back pain: Secondary | ICD-10-CM | POA: Diagnosis not present

## 2019-11-26 DIAGNOSIS — N433 Hydrocele, unspecified: Secondary | ICD-10-CM | POA: Diagnosis not present

## 2019-11-26 DIAGNOSIS — Z8719 Personal history of other diseases of the digestive system: Secondary | ICD-10-CM | POA: Diagnosis not present

## 2019-11-26 DIAGNOSIS — E119 Type 2 diabetes mellitus without complications: Secondary | ICD-10-CM | POA: Diagnosis not present

## 2019-11-26 DIAGNOSIS — K4020 Bilateral inguinal hernia, without obstruction or gangrene, not specified as recurrent: Secondary | ICD-10-CM | POA: Diagnosis not present

## 2019-11-26 DIAGNOSIS — R208 Other disturbances of skin sensation: Secondary | ICD-10-CM | POA: Diagnosis not present

## 2019-11-26 DIAGNOSIS — Z8619 Personal history of other infectious and parasitic diseases: Secondary | ICD-10-CM | POA: Diagnosis not present

## 2019-11-26 DIAGNOSIS — R079 Chest pain, unspecified: Secondary | ICD-10-CM | POA: Diagnosis not present

## 2019-11-27 DIAGNOSIS — N433 Hydrocele, unspecified: Secondary | ICD-10-CM | POA: Diagnosis not present

## 2019-11-27 DIAGNOSIS — K429 Umbilical hernia without obstruction or gangrene: Secondary | ICD-10-CM | POA: Diagnosis not present

## 2019-11-27 DIAGNOSIS — Z9049 Acquired absence of other specified parts of digestive tract: Secondary | ICD-10-CM | POA: Diagnosis not present

## 2019-11-27 DIAGNOSIS — K4020 Bilateral inguinal hernia, without obstruction or gangrene, not specified as recurrent: Secondary | ICD-10-CM | POA: Diagnosis not present

## 2019-12-25 ENCOUNTER — Ambulatory Visit: Payer: Medicare HMO | Admitting: Endocrinology

## 2020-01-26 ENCOUNTER — Telehealth: Payer: Medicare PPO | Admitting: Internal Medicine

## 2020-07-06 ENCOUNTER — Encounter: Payer: Self-pay | Admitting: Pulmonary Disease

## 2020-08-17 IMAGING — CT CT ABD-PELV W/ CM
2 of 5 series · 15 of 46 positions shown, 17 images · IV contrast (APPLIED)
Comparison: None.

CLINICAL DATA: Epigastric pain status post gallbladder surgery also
question of prior history of pancreatitis

EXAM:
CT ABDOMEN AND PELVIS WITH CONTRAST
TECHNIQUE: Multidetector CT imaging of the abdomen and pelvis was performed
using the standard protocol following bolus administration of
intravenous contrast.
CONTRAST:  100mL OMNIPAQUE IOHEXOL 300 MG/ML  SOLN

[Series 2: axial st · axial · 0.87mm/px · z∈[-453,+37]mm · 12 of 112 slices shown, 14 images]
[im 7/112  soft-tissue]
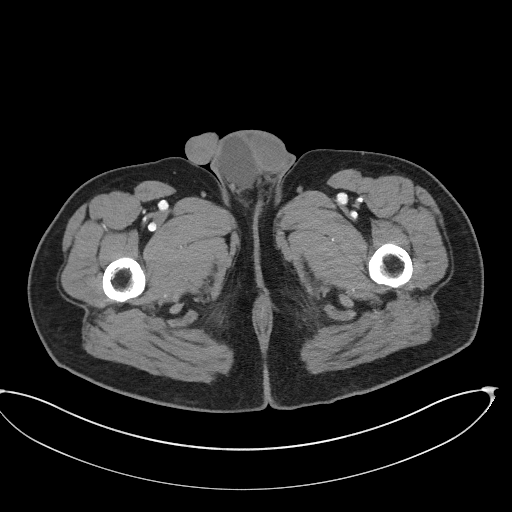
[im 7/112  bone]
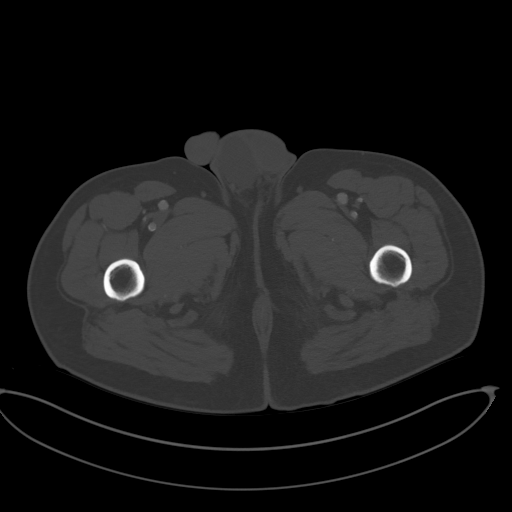
[im 14/112  soft-tissue]
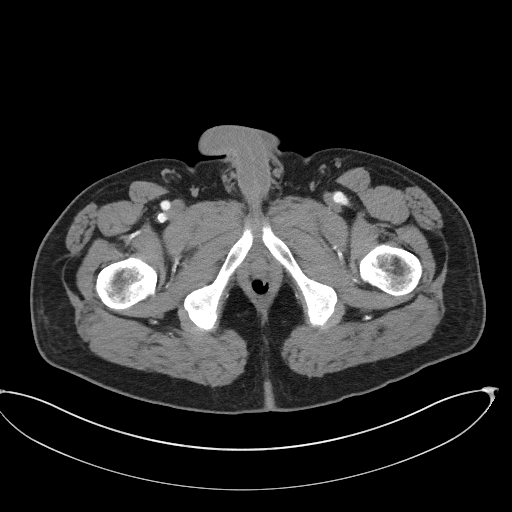
[im 28/112  soft-tissue]
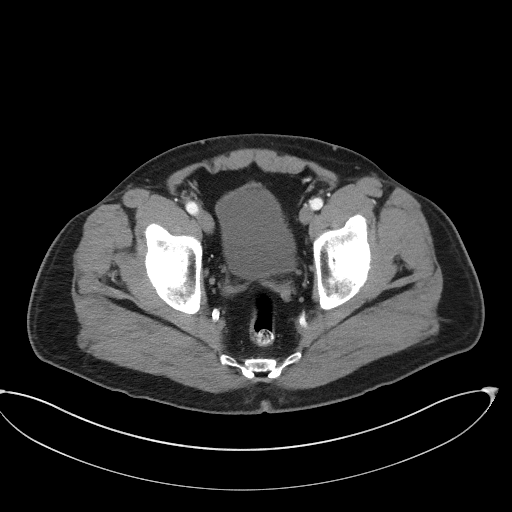
[im 35/112  soft-tissue]
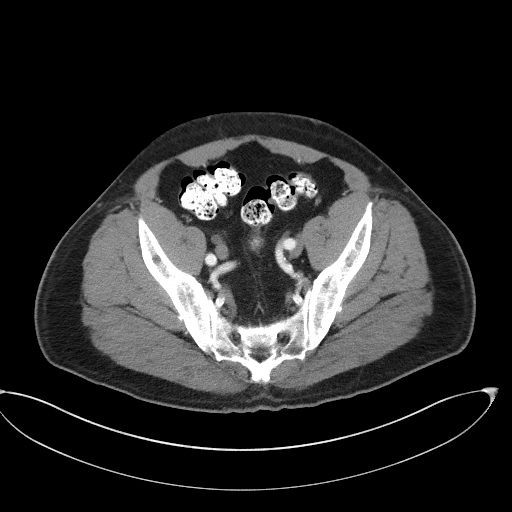
[im 42/112  soft-tissue]
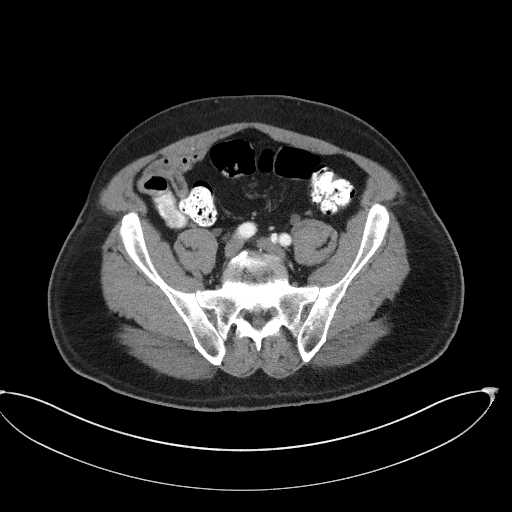
[im 49/112  soft-tissue]
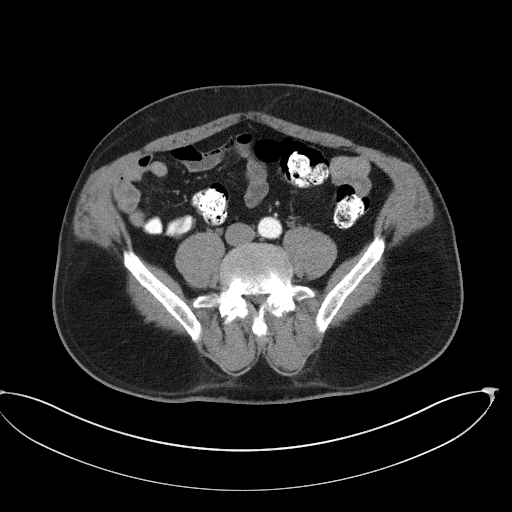
[im 63/112  soft-tissue]
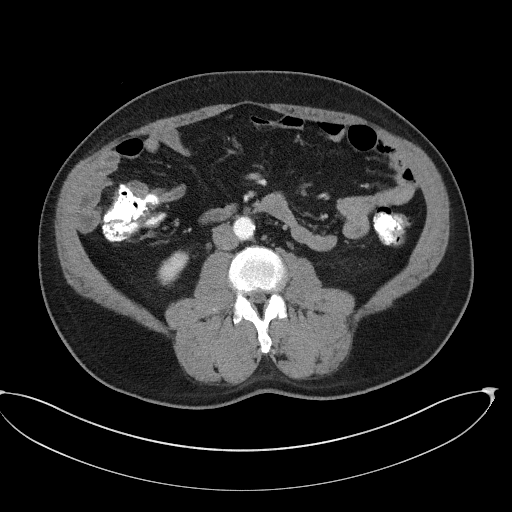
[im 70/112  soft-tissue]
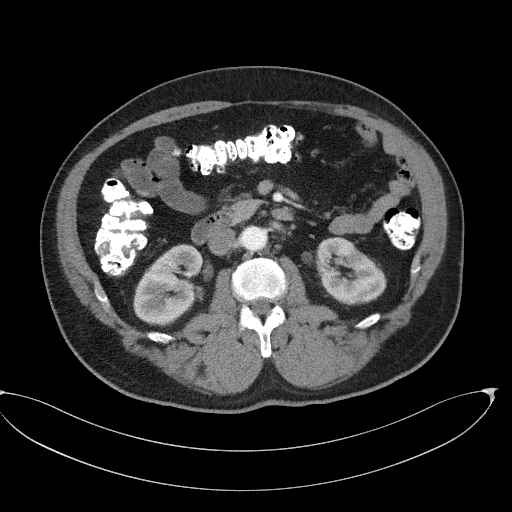
[im 77/112  soft-tissue]
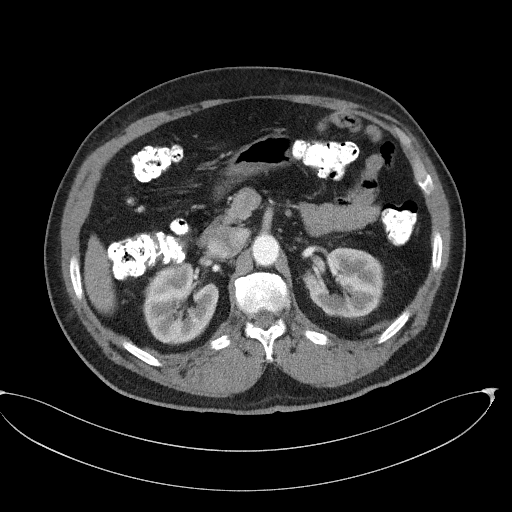
[im 77/112  bone]
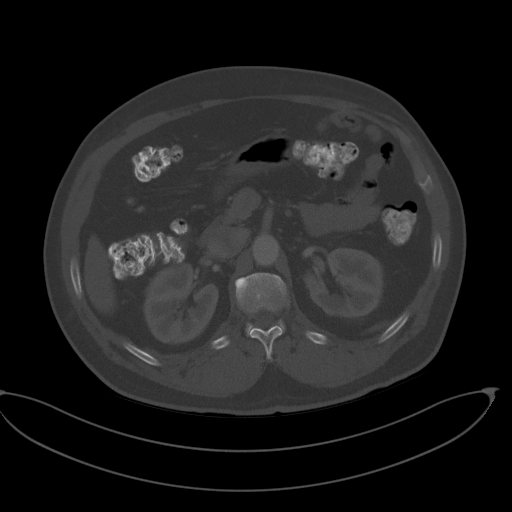
[im 84/112  soft-tissue]
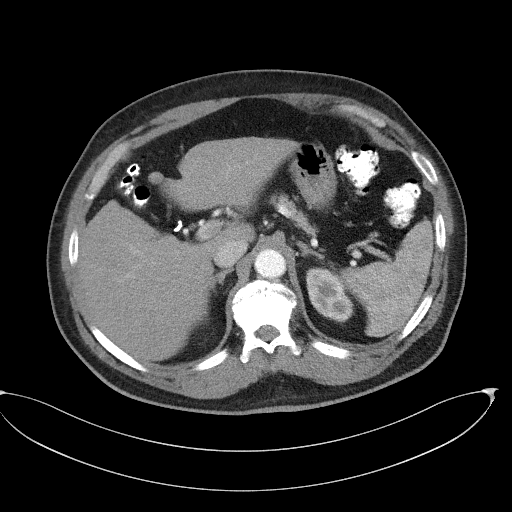
[im 98/112  soft-tissue]
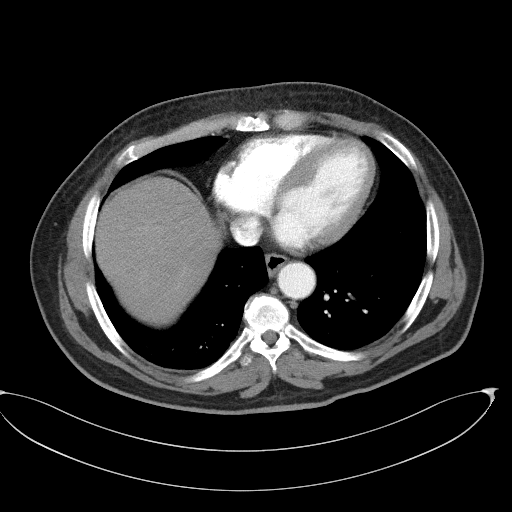
[im 105/112  soft-tissue]
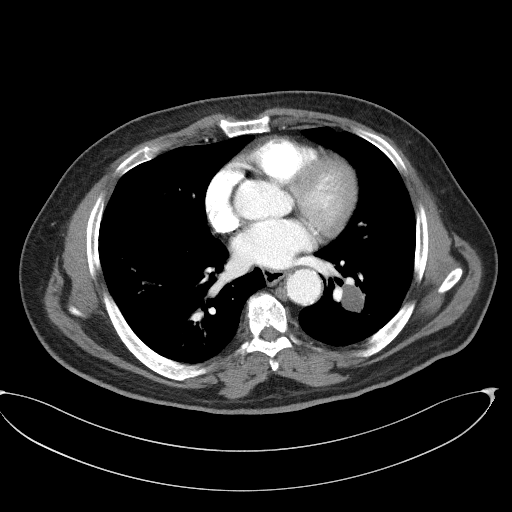

[Series 4: coronal st · coronal · 0.82mm/px · 3 of 106 slices shown]
[im 36/106  soft-tissue]
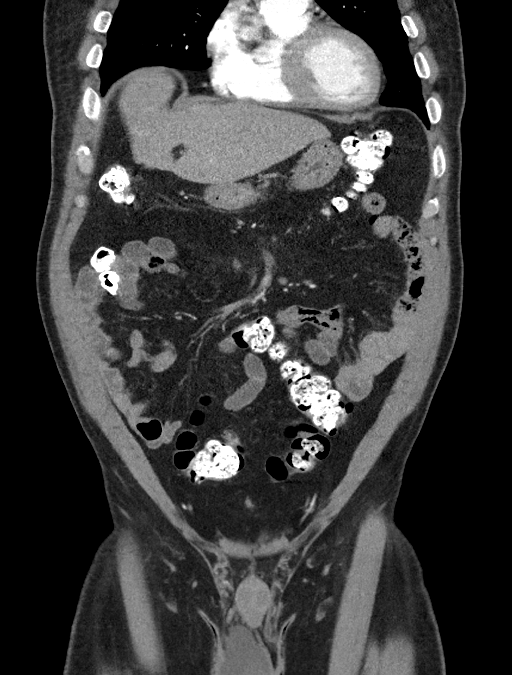
[im 47/106  soft-tissue]
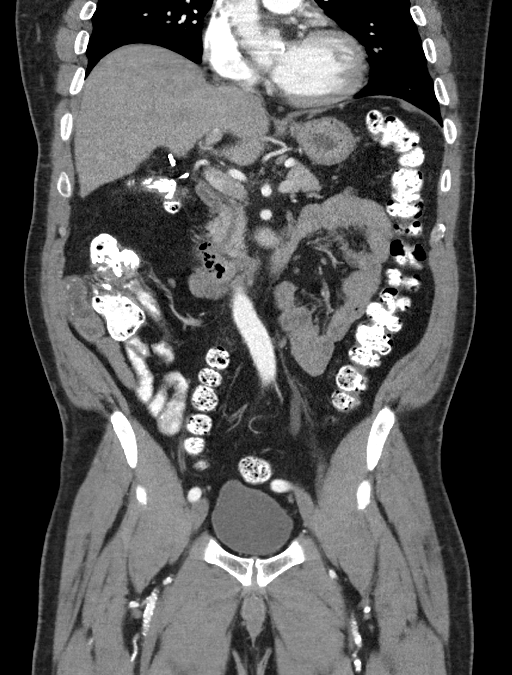
[im 59/106  soft-tissue]
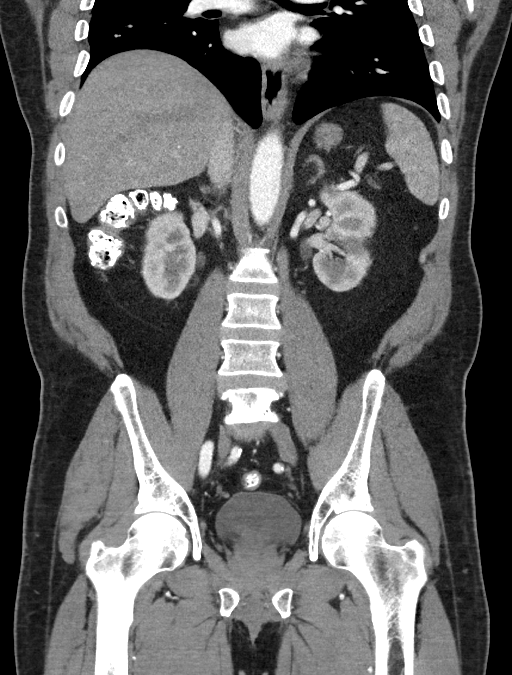

[15 of 46 positions shown; findings below may reference images not displayed]

FINDINGS: Lower chest: Ovoid area of intermediate density, approximately 30
Hounsfield units in the left infrahilar region measures it has 2.5 x
1.8 cm. Basilar atelectasis. No signs of pleural effusion. No signs
of pericardial effusion.

Hepatobiliary: Lobular hepatic contours without suspicious hepatic
lesion. Replaced left hepatic artery arises from the left gastric
artery.

Pancreas: Unremarkable. No pancreatic ductal dilatation or
surrounding inflammatory changes.

Spleen: Normal in size without focal abnormality.

Adrenals/Urinary Tract: The normal adrenal glands. Signs of renal
cortical scarring on the left diffusely in the left kidney. Right
kidney is relatively spared.

Stomach/Bowel: No sign of acute gastrointestinal process. Normal
appendix. Small hiatal hernia.

Vascular/Lymphatic: Calcified atherosclerotic changes, mild and
associated with noncalcified plaque in the abdominal aorta, no signs
of aneurysm. Patent abdominal vasculature. No signs of adenopathy in
the abdomen.

No signs of pelvic lymphadenopathy.

Atheromatous plaque approximately 8 mm in thickness protruding into
the lumen of the distal thoracic aorta.

Reproductive: Prostate unremarkable by CT.

Large right hydrocele partially imaged.

Other: No abdominal wall hernia or abnormality. No abdominopelvic
ascites.

Musculoskeletal: Spinal degenerative changes greatest at the
thoracolumbar junction and lower thoracic spine. No signs of
destructive bone process or acute bony abnormality.
IMPRESSION: 1. 2.5 x 1.8 cm round area of intermediate density in the left
infrahilar region, in the lower lobe. Suggest pulmonary consultation
and consideration for biopsy or comparison with previous imaging. No
signs of macroscopic fat that would allow for definitive
characterization is a benign lesion such as hamartoma. Dedicated
imaging of the chest could also be helpful to assess for other
lesions.
2. Atheroma protruding into the distal thoracic aorta, approximately
8 mm in thickness. Plaques with this morphology and size can be
associated with atheroemboli. Clinical correlation is suggested.
3. Large right hydrocele partially imaged.

Aortic Atherosclerosis ([id number]).
